# Patient Record
Sex: Male | Born: 1970 | Race: White | Hispanic: No | Marital: Single | Smoking: Current every day smoker
Health system: Southern US, Community
[De-identification: ages and names within clinical notes are randomized; demographics above are authoritative.]

---

## 1998-01-01 ENCOUNTER — Emergency Department (HOSPITAL_COMMUNITY): Admission: EM | Admit: 1998-01-01 | Discharge: 1998-01-01 | Payer: Self-pay | Admitting: Emergency Medicine

## 1999-03-20 ENCOUNTER — Emergency Department (HOSPITAL_COMMUNITY): Admission: EM | Admit: 1999-03-20 | Discharge: 1999-03-20 | Payer: Self-pay | Admitting: Emergency Medicine

## 1999-03-28 ENCOUNTER — Encounter: Admission: RE | Admit: 1999-03-28 | Discharge: 1999-03-28 | Payer: Self-pay | Admitting: Internal Medicine

## 2017-09-12 ENCOUNTER — Inpatient Hospital Stay (HOSPITAL_COMMUNITY)
Admission: EM | Admit: 2017-09-12 | Discharge: 2017-09-14 | DRG: 917 | Disposition: A | Discharge status: Other Acute Inpatient Hospital | Payer: BLUE CROSS/BLUE SHIELD | Attending: Family Medicine | Admitting: Family Medicine

## 2017-09-12 ENCOUNTER — Encounter (HOSPITAL_COMMUNITY): Payer: Self-pay | Admitting: Emergency Medicine

## 2017-09-12 ENCOUNTER — Emergency Department (HOSPITAL_COMMUNITY): Payer: BLUE CROSS/BLUE SHIELD

## 2017-09-12 DIAGNOSIS — F1290 Cannabis use, unspecified, uncomplicated: Secondary | ICD-10-CM | POA: Diagnosis present

## 2017-09-12 DIAGNOSIS — F332 Major depressive disorder, recurrent severe without psychotic features: Secondary | ICD-10-CM | POA: Diagnosis present

## 2017-09-12 DIAGNOSIS — J9601 Acute respiratory failure with hypoxia: Secondary | ICD-10-CM | POA: Diagnosis present

## 2017-09-12 DIAGNOSIS — Z978 Presence of other specified devices: Secondary | ICD-10-CM

## 2017-09-12 DIAGNOSIS — Y907 Blood alcohol level of 200-239 mg/100 ml: Secondary | ICD-10-CM | POA: Diagnosis present

## 2017-09-12 DIAGNOSIS — F1110 Opioid abuse, uncomplicated: Secondary | ICD-10-CM | POA: Diagnosis present

## 2017-09-12 DIAGNOSIS — Z9189 Other specified personal risk factors, not elsewhere classified: Secondary | ICD-10-CM

## 2017-09-12 DIAGNOSIS — F10229 Alcohol dependence with intoxication, unspecified: Secondary | ICD-10-CM | POA: Diagnosis present

## 2017-09-12 DIAGNOSIS — T424X2A Poisoning by benzodiazepines, intentional self-harm, initial encounter: Principal | ICD-10-CM | POA: Diagnosis present

## 2017-09-12 DIAGNOSIS — E872 Acidosis: Secondary | ICD-10-CM | POA: Diagnosis present

## 2017-09-12 DIAGNOSIS — T50902A Poisoning by unspecified drugs, medicaments and biological substances, intentional self-harm, initial encounter: Secondary | ICD-10-CM

## 2017-09-12 DIAGNOSIS — T1491XA Suicide attempt, initial encounter: Secondary | ICD-10-CM | POA: Diagnosis not present

## 2017-09-12 DIAGNOSIS — F1020 Alcohol dependence, uncomplicated: Secondary | ICD-10-CM | POA: Diagnosis not present

## 2017-09-12 DIAGNOSIS — R0689 Other abnormalities of breathing: Secondary | ICD-10-CM

## 2017-09-12 DIAGNOSIS — R402431 Glasgow coma scale score 3-8, in the field [EMT or ambulance]: Secondary | ICD-10-CM

## 2017-09-12 DIAGNOSIS — G92 Toxic encephalopathy: Secondary | ICD-10-CM | POA: Diagnosis present

## 2017-09-12 DIAGNOSIS — R739 Hyperglycemia, unspecified: Secondary | ICD-10-CM | POA: Diagnosis present

## 2017-09-12 DIAGNOSIS — J9811 Atelectasis: Secondary | ICD-10-CM | POA: Diagnosis present

## 2017-09-12 DIAGNOSIS — F1011 Alcohol abuse, in remission: Secondary | ICD-10-CM

## 2017-09-12 DIAGNOSIS — Z8659 Personal history of other mental and behavioral disorders: Secondary | ICD-10-CM

## 2017-09-12 DIAGNOSIS — G9340 Encephalopathy, unspecified: Secondary | ICD-10-CM | POA: Diagnosis present

## 2017-09-12 DIAGNOSIS — F1490 Cocaine use, unspecified, uncomplicated: Secondary | ICD-10-CM | POA: Diagnosis present

## 2017-09-12 DIAGNOSIS — Z781 Physical restraint status: Secondary | ICD-10-CM

## 2017-09-12 DIAGNOSIS — R450 Nervousness: Secondary | ICD-10-CM | POA: Diagnosis not present

## 2017-09-12 DIAGNOSIS — F17210 Nicotine dependence, cigarettes, uncomplicated: Secondary | ICD-10-CM | POA: Diagnosis not present

## 2017-09-12 LAB — CBC WITH DIFFERENTIAL/PLATELET
BASOS ABS: 0 10*3/uL (ref 0.0–0.1)
Basophils Relative: 0 %
EOS PCT: 1 %
Eosinophils Absolute: 0.2 10*3/uL (ref 0.0–0.7)
HCT: 51.7 % (ref 39.0–52.0)
Hemoglobin: 17.4 g/dL — ABNORMAL HIGH (ref 13.0–17.0)
LYMPHS ABS: 3.6 10*3/uL (ref 0.7–4.0)
LYMPHS PCT: 30 %
MCH: 31.6 pg (ref 26.0–34.0)
MCHC: 33.7 g/dL (ref 30.0–36.0)
MCV: 93.8 fL (ref 78.0–100.0)
Monocytes Absolute: 0.6 10*3/uL (ref 0.1–1.0)
Monocytes Relative: 5 %
NEUTROS ABS: 7.9 10*3/uL — AB (ref 1.7–7.7)
Neutrophils Relative %: 64 %
PLATELETS: 233 10*3/uL (ref 150–400)
RBC: 5.51 MIL/uL (ref 4.22–5.81)
RDW: 14.8 % (ref 11.5–15.5)
WBC: 12.3 10*3/uL — AB (ref 4.0–10.5)

## 2017-09-12 LAB — COMPREHENSIVE METABOLIC PANEL
ALK PHOS: 60 U/L (ref 38–126)
ALT: 26 U/L (ref 17–63)
ANION GAP: 14 (ref 5–15)
AST: 33 U/L (ref 15–41)
Albumin: 4 g/dL (ref 3.5–5.0)
BILIRUBIN TOTAL: 0.7 mg/dL (ref 0.3–1.2)
BUN: 8 mg/dL (ref 6–20)
CALCIUM: 8.7 mg/dL — AB (ref 8.9–10.3)
CO2: 21 mmol/L — ABNORMAL LOW (ref 22–32)
Chloride: 105 mmol/L (ref 101–111)
Creatinine, Ser: 1.15 mg/dL (ref 0.61–1.24)
GFR calc Af Amer: >60 mL/min (ref >60)
Glucose, Bld: 124 mg/dL — ABNORMAL HIGH (ref 65–99)
POTASSIUM: 3.5 mmol/L (ref 3.5–5.1)
Sodium: 140 mmol/L (ref 135–145)
TOTAL PROTEIN: 6.5 g/dL (ref 6.5–8.1)

## 2017-09-12 LAB — URINALYSIS, ROUTINE W REFLEX MICROSCOPIC
BACTERIA UA: NONE SEEN
BILIRUBIN URINE: NEGATIVE
Glucose, UA: NEGATIVE mg/dL
KETONES UR: NEGATIVE mg/dL
Leukocytes, UA: NEGATIVE
NITRITE: NEGATIVE
PH: 5 (ref 5.0–8.0)
Protein, ur: NEGATIVE mg/dL
Specific Gravity, Urine: 1.005 (ref 1.005–1.030)

## 2017-09-12 LAB — GLUCOSE, CAPILLARY: Glucose-Capillary: 100 mg/dL — ABNORMAL HIGH (ref 65–99)

## 2017-09-12 LAB — RAPID URINE DRUG SCREEN, HOSP PERFORMED
Amphetamines: NOT DETECTED
BENZODIAZEPINES: POSITIVE — AB
Barbiturates: NOT DETECTED
COCAINE: NOT DETECTED
Opiates: NOT DETECTED
Tetrahydrocannabinol: NOT DETECTED

## 2017-09-12 LAB — SALICYLATE LEVEL: Salicylate Lvl: <7 mg/dL (ref 2.8–30.0)

## 2017-09-12 LAB — I-STAT CG4 LACTIC ACID, ED
LACTIC ACID, VENOUS: 2.85 mmol/L — AB (ref 0.5–1.9)
Lactic Acid, Venous: 3.08 mmol/L (ref 0.5–1.9)

## 2017-09-12 LAB — TRIGLYCERIDES: TRIGLYCERIDES: 178 mg/dL — AB (ref <150)

## 2017-09-12 LAB — I-STAT CHEM 8, ED
BUN: 8 mg/dL (ref 6–20)
CALCIUM ION: 0.99 mmol/L — AB (ref 1.15–1.40)
CHLORIDE: 106 mmol/L (ref 101–111)
Creatinine, Ser: 1.3 mg/dL — ABNORMAL HIGH (ref 0.61–1.24)
Glucose, Bld: 122 mg/dL — ABNORMAL HIGH (ref 65–99)
HCT: 52 % (ref 39.0–52.0)
HEMOGLOBIN: 17.7 g/dL — AB (ref 13.0–17.0)
Potassium: 3.2 mmol/L — ABNORMAL LOW (ref 3.5–5.1)
SODIUM: 142 mmol/L (ref 135–145)
TCO2: 23 mmol/L (ref 22–32)

## 2017-09-12 LAB — I-STAT TROPONIN, ED: Troponin i, poc: 0 ng/mL (ref 0.00–0.08)

## 2017-09-12 LAB — ACETAMINOPHEN LEVEL: Acetaminophen (Tylenol), Serum: <10 ug/mL — ABNORMAL LOW (ref 10–30)

## 2017-09-12 LAB — ETHANOL: ALCOHOL ETHYL (B): 208 mg/dL — AB (ref <10)

## 2017-09-12 MED ORDER — FENTANYL CITRATE (PF) 100 MCG/2ML IJ SOLN
100.0000 ug | INTRAMUSCULAR | Status: DC | PRN
  Filled 2017-09-12: qty 2

## 2017-09-12 MED ORDER — NALOXONE HCL 2 MG/2ML IJ SOSY
PREFILLED_SYRINGE | INTRAMUSCULAR | Status: AC
  Administered 2017-09-12: 2 mg
  Filled 2017-09-12: qty 2

## 2017-09-12 MED ORDER — ADULT MULTIVITAMIN W/MINERALS CH
1.0000 | ORAL_TABLET | Freq: Every day | ORAL | Status: DC
  Administered 2017-09-13 – 2017-09-14 (×2): 1
  Filled 2017-09-12 (×2): qty 1

## 2017-09-12 MED ORDER — SODIUM CHLORIDE 0.9 % IV SOLN
250.0000 mL | INTRAVENOUS | Status: DC | PRN
  Administered 2017-09-12: 250 mL via INTRAVENOUS

## 2017-09-12 MED ORDER — SODIUM CHLORIDE 0.9 % IV BOLUS
1000.0000 mL | Freq: Once | INTRAVENOUS | Status: AC
  Administered 2017-09-12: 1000 mL via INTRAVENOUS

## 2017-09-12 MED ORDER — FENTANYL CITRATE (PF) 100 MCG/2ML IJ SOLN
100.0000 ug | INTRAMUSCULAR | Status: DC | PRN
  Administered 2017-09-13: 100 ug via INTRAVENOUS

## 2017-09-12 MED ORDER — PROPOFOL 1000 MG/100ML IV EMUL
5.0000 ug/kg/min | INTRAVENOUS | Status: DC
  Administered 2017-09-12: 5 ug/kg/min via INTRAVENOUS
  Filled 2017-09-12: qty 100

## 2017-09-12 MED ORDER — FOLIC ACID 1 MG PO TABS
1.0000 mg | ORAL_TABLET | Freq: Every day | ORAL | Status: DC
  Administered 2017-09-13 – 2017-09-14 (×2): 1 mg
  Filled 2017-09-12 (×2): qty 1

## 2017-09-12 MED ORDER — FENTANYL CITRATE (PF) 100 MCG/2ML IJ SOLN
INTRAMUSCULAR | Status: AC
  Administered 2017-09-12: 100 ug
  Filled 2017-09-12: qty 2

## 2017-09-12 MED ORDER — ETOMIDATE 2 MG/ML IV SOLN
INTRAVENOUS | Status: AC | PRN
  Administered 2017-09-12: 20 mg via INTRAVENOUS

## 2017-09-12 MED ORDER — DOCUSATE SODIUM 50 MG/5ML PO LIQD: 100.0000 mg | Freq: Two times a day (BID) | ORAL | Status: DC | PRN

## 2017-09-12 MED ORDER — INSULIN ASPART 100 UNIT/ML ~~LOC~~ SOLN: 1.0000 [IU] | SUBCUTANEOUS | Status: DC

## 2017-09-12 MED ORDER — ROCURONIUM BROMIDE 50 MG/5ML IV SOLN
INTRAVENOUS | Status: AC | PRN
  Administered 2017-09-12: 90 mg via INTRAVENOUS

## 2017-09-12 MED ORDER — BISACODYL 10 MG RE SUPP: 10.0000 mg | Freq: Every day | RECTAL | Status: DC | PRN

## 2017-09-12 MED ORDER — THIAMINE HCL 100 MG/ML IJ SOLN
100.0000 mg | Freq: Every day | INTRAMUSCULAR | Status: DC
  Administered 2017-09-13 – 2017-09-14 (×3): 100 mg via INTRAVENOUS
  Filled 2017-09-12 (×3): qty 2

## 2017-09-12 MED ORDER — HEPARIN SODIUM (PORCINE) 5000 UNIT/ML IJ SOLN
5000.0000 [IU] | Freq: Three times a day (TID) | INTRAMUSCULAR | Status: DC
  Administered 2017-09-13 – 2017-09-14 (×5): 5000 [IU] via SUBCUTANEOUS
  Filled 2017-09-12 (×5): qty 1

## 2017-09-12 MED ORDER — PROPOFOL 1000 MG/100ML IV EMUL
0.0000 ug/kg/min | INTRAVENOUS | Status: DC
  Administered 2017-09-12: 40 ug/kg/min via INTRAVENOUS
  Administered 2017-09-13: 20 ug/kg/min via INTRAVENOUS
  Filled 2017-09-12: qty 100

## 2017-09-12 MED ORDER — LACTATED RINGERS IV SOLN
INTRAVENOUS | Status: DC
  Administered 2017-09-12: via INTRAVENOUS

## 2017-09-12 MED ORDER — POTASSIUM CHLORIDE 10 MEQ/100ML IV SOLN
10.0000 meq | INTRAVENOUS | Status: AC
  Administered 2017-09-13 (×4): 10 meq via INTRAVENOUS
  Filled 2017-09-12 (×4): qty 100

## 2017-09-12 MED ORDER — FAMOTIDINE 40 MG/5ML PO SUSR
20.0000 mg | Freq: Two times a day (BID) | ORAL | Status: DC
  Administered 2017-09-13: 20 mg
  Filled 2017-09-12: qty 2.5

## 2017-09-12 MED ORDER — PROPOFOL 10 MG/ML IV BOLUS
INTRAVENOUS | Status: AC
  Administered 2017-09-12: 200 mg
  Filled 2017-09-12: qty 20

## 2017-09-12 NOTE — Progress Notes (Signed)
Pt transferred to 3M6 w/o complications. Transported on 100%. Uneventful trip

## 2017-09-12 NOTE — ED Notes (Signed)
Belonging given to wife.

## 2017-09-12 NOTE — ED Triage Notes (Signed)
Brought by ems after being found unresponsive in driveway of residence.  Per family patient was texting he wanted to end it all.  Per family possibly taken xanax.  Empty bottle given to ems.  CBG 60 via ems.  Per family took 28 xanax.  Bottle filled 02/2016.

## 2017-09-12 NOTE — ED Notes (Signed)
Little response after narcan .

## 2017-09-12 NOTE — H&P (Signed)
PULMONARY / CRITICAL CARE MEDICINE   Name: Billy Lawrence MRN: [id number] DOB: January 09, 1971    ADMISSION DATE:  09/12/2017 CONSULTATION DATE:  09/12/2017  REFERRING MD:  Dr. Dalene Seltzer  CHIEF COMPLAINT:  Acute encephalopathy  HISTORY OF PRESENT ILLNESS:   HPI obtained from medical chart review and from family at bedside as patient is intubated and sedated on mechanical ventilation.    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  No other known PMH or daily medications known.    Ex-wife and their two minor kids (14 and 15) at the bedside; they have an older daughter, 45 yo but is estranged from her father.  His 31 year old daughter states she received a concerning text from her father, therefore his ex-wife called him and he said "dont call the police" and that he wanted to "end it all".   Patient's other ex-wife died in Sep 12, 2015 and recently his girlfriend broke up with him.  EMS found the patient with empty bottles of xanax 1 mg prescribed to his deceased's ex-wife in 09/12/15. Suspected up to 20 pills ingested.   Patient arrived in ER minimally responsive with no response to narcan, requiring intubation for airway protection.  No vomiting in ER and OGT with minimal clear output.  Labs noted for Lactate 2.85 increasing to 3.08, WBC 12.3, Hgb 17.4, glucose 124, neg troponin, sCr 1.15, K 3.5, ETOH 208, UDS positive for benzos, negative acetaminophen and salicylate levels.  Concern over head trauma due to forehead abrasion, CT head and cervical negative.  Required propofol in ER for some agitation.  PCCM called for admit.   PAST MEDICAL HISTORY :  He  has no past medical history on file.  PAST SURGICAL HISTORY: He  has no past surgical history on file.  Not on File  No current facility-administered medications on file prior to encounter.    No current outpatient medications on file prior to encounter.    FAMILY HISTORY:  His  has no family status information on file.    SOCIAL HISTORY: He  reports that he has current or past drug history.  REVIEW OF SYSTEMS:   Unable.  SUBJECTIVE:   VITAL SIGNS: BP (!) 120/92   Pulse 93   Temp (!) 96.4 F (35.8 C) (Rectal)   Resp 17   Ht 6' (1.829 m)   Wt 233 lb 11 oz (106 kg)   SpO2 100%   BMI 31.69 kg/m   HEMODYNAMICS:    VENTILATOR SETTINGS: Vent Mode: PRVC FiO2 (%):  [60 %] 60 % Set Rate:  [24 bmp] 24 bmp Vt Set:  [580 mL] 580 mL PEEP:  [5 cmH20] 5 cmH20 Plateau Pressure:  [16 cmH20] 16 cmH20  INTAKE / OUTPUT: No intake/output data recorded.  PHYSICAL EXAMINATION: General:  Critically ill adult male sedated on MV in NAD HEENT: MM pink/moist, ETT/ OGT present, pupils 4/ reactive. Abrasion to right forehead  Neuro: sedated on propofol, does not follow commands CV: rrr, no m/r/g PULM: even/non-labored on MV, lungs bilaterally coarse GI: soft, ND, bs active, foley present Extremities: warm/dry, no BLE edema  Skin: tan, no rashes or lesions  LABS:  BMET Recent Labs  Lab 09/12/17 09-11-45 09/12/17 Sep 11, 2049  NA 142 140  K 3.2* 3.5  CL 106 105  CO2  --  21*  BUN 8 8  CREATININE 1.30* 1.15  GLUCOSE 122* 124*    Electrolytes Recent Labs  Lab  09/12/17 2051  CALCIUM 8.7*    CBC Recent Labs  Lab 09/12/17 2047 09/12/17 2051  WBC  --  12.3*  HGB 17.7* 17.4*  HCT 52.0 51.7  PLT  --  233    Coag's No results for input(s): APTT, INR in the last 168 hours.  Sepsis Markers Recent Labs  Lab 09/12/17 2051  LATICACIDVEN 2.85*    ABG No results for input(s): PHART, PCO2ART, PO2ART in the last 168 hours.  Liver Enzymes Recent Labs  Lab 09/12/17 2051  AST 33  ALT 26  ALKPHOS 60  BILITOT 0.7  ALBUMIN 4.0    Cardiac Enzymes No results for input(s): TROPONINI, PROBNP in the last 168 hours.  Glucose No results for input(s): GLUCAP in the last 168 hours.  Imaging Dg Chest Portable 1 View  Result Date: 09/12/2017 CLINICAL DATA:   Post intubation EXAM: PORTABLE CHEST 1 VIEW COMPARISON:  None. FINDINGS: Endotracheal tube tip is at the carina. Esophageal tube tip is below the diaphragm but non included. Right lung is clear. Patchy atelectasis at the left base. Cardiomediastinal silhouette within normal limits. No pneumothorax. IMPRESSION: 1. Endotracheal tube tip at the carina 2. Esophageal tube tip below the diaphragm but non included 3. Slight elevation of left diaphragm with left basilar atelectasis Electronically Signed   By: Jasmine Pang M.D.   On: 09/12/2017 21:05   STUDIES:  5/4 CT head and cervical >> 1. No acute intracranial pathology. 2. No acute/traumatic cervical spine pathology  CXR 5/4 >> 1. Endotracheal tube tip at the carina 2. Esophageal tube tip below the diaphragm but non included 3. Slight elevation of left diaphragm with left basilar atelectasis  CULTURES: 5/4 MRSA PCR >>  ANTIBIOTICS: none  SIGNIFICANT EVENTS: 5/4 Admit  LINES/TUBES: PIV 20g and 18 g 5/4 ETT >> 5/4 OGT >> 5/4 Foley >>  DISCUSSION: 73 yoM presenting with acute encephalopathy requiring intubation after verbalizing SI to his ex-wife.  Ingested xanax and +ETOH.    ASSESSMENT / PLAN:  PULMONARY A: Acute respiratory insufficiency in the setting of toxic ingestion (ETOH/ xanax) LLL atelectasis vs ? aspiration P:   Full MV support, PRVC 8 cc/kg Wean FiO2 / peep for sats > 92% SBT / extubate after metabolism of meds, SBT in am  VAP protocol  ABG and repeat CXR- may need to retract ETT, unclear if this was done in ER See ID   CARDIOVASCULAR A:  Sinus tachycardia  - neg troponin/ EKG non-acute P:  Tele monitoring Goal MAP > 65 Trend Lactate Repeat EKG and troponin in 3 hours  RENAL A:   Lactate acidosis  P:   Assess Mag 1L bolus, then LR at 50 ml/hr Trend BMP / mag/ phos/ urinary output Replace electrolytes as indicated  GASTROINTESTINAL A:   No acute issues  - normal LFTs P:   NPO for now Pepcid  for SUP  HEMATOLOGIC A:   Hgb 17 P:  Trend CBC SCDs and heparin SQ for VTE ppx   INFECTIOUS A:   Leukocytosis - mild ? LLL  atelectasis vs ? Aspiration - no known vomiting P:   Assess PCT Monitor clinically for now Trend WBC / fever curve  ENDOCRINE A:   Mild hyperglycemia  P:   CBG Start SSI if continued hyperglycemia  NEUROLOGIC A:   Suspected SI attempt with xanax and ETOH Acute encephalopathy related to above At risk for ETOH withdrawals - per family long ETOH abuse - CTH and cervical negative - UDS + for  benzo, tylenol and salicylate neg, ETOH 208 P:   RASS goal: -1 PAD protocol with propofol and prn fentanyl May need to add/ switch to precedex; monitor for withdrawals WAU in am Will need safety sitter at bedside once extubated Psych consult when alert and oriented  Thiamine, MVI, and folate daily    FAMILY  - Updates:  Ex-wife and their two minor children updated at bedside.  She will try and contact the eldest daughter who is 74 but also estranged from her father to determine who will make decisions for patient.  Otherwise it will fall to his parents who have been notified.    - Inter-disciplinary family meet or Palliative Care meeting due by:  5/11  CCT 45 mins  Posey Boyer, AGACNP-BC [facility] Pgr: [phone] or if no answer [phone] 09/12/2017, 11:35 PM

## 2017-09-13 ENCOUNTER — Inpatient Hospital Stay (HOSPITAL_COMMUNITY): Payer: BLUE CROSS/BLUE SHIELD

## 2017-09-13 DIAGNOSIS — F10229 Alcohol dependence with intoxication, unspecified: Secondary | ICD-10-CM

## 2017-09-13 DIAGNOSIS — T510X2A Toxic effect of ethanol, intentional self-harm, initial encounter: Secondary | ICD-10-CM

## 2017-09-13 DIAGNOSIS — Z8659 Personal history of other mental and behavioral disorders: Secondary | ICD-10-CM

## 2017-09-13 DIAGNOSIS — F10239 Alcohol dependence with withdrawal, unspecified: Secondary | ICD-10-CM

## 2017-09-13 DIAGNOSIS — F419 Anxiety disorder, unspecified: Secondary | ICD-10-CM

## 2017-09-13 DIAGNOSIS — J9601 Acute respiratory failure with hypoxia: Secondary | ICD-10-CM

## 2017-09-13 DIAGNOSIS — T424X2A Poisoning by benzodiazepines, intentional self-harm, initial encounter: Principal | ICD-10-CM

## 2017-09-13 DIAGNOSIS — F1011 Alcohol abuse, in remission: Secondary | ICD-10-CM

## 2017-09-13 DIAGNOSIS — Z634 Disappearance and death of family member: Secondary | ICD-10-CM

## 2017-09-13 DIAGNOSIS — R450 Nervousness: Secondary | ICD-10-CM

## 2017-09-13 DIAGNOSIS — T1491XA Suicide attempt, initial encounter: Secondary | ICD-10-CM

## 2017-09-13 DIAGNOSIS — R4587 Impulsiveness: Secondary | ICD-10-CM

## 2017-09-13 DIAGNOSIS — G9340 Encephalopathy, unspecified: Secondary | ICD-10-CM

## 2017-09-13 LAB — POCT I-STAT 3, ART BLOOD GAS (G3+)
ACID-BASE EXCESS: 1 mmol/L (ref 0.0–2.0)
Acid-base deficit: 2 mmol/L (ref 0.0–2.0)
Bicarbonate: 22.9 mmol/L (ref 20.0–28.0)
Bicarbonate: 27.6 mmol/L (ref 20.0–28.0)
O2 Saturation: 99 %
O2 Saturation: 99 %
PCO2 ART: 37 mmHg (ref 32.0–48.0)
PH ART: 7.397 (ref 7.350–7.450)
PO2 ART: 117 mmHg — AB (ref 83.0–108.0)
PO2 ART: 166 mmHg — AB (ref 83.0–108.0)
Patient temperature: 97.8
TCO2: 24 mmol/L (ref 22–32)
TCO2: 29 mmol/L (ref 22–32)
pCO2 arterial: 49 mmHg — ABNORMAL HIGH (ref 32.0–48.0)
pH, Arterial: 7.361 (ref 7.350–7.450)

## 2017-09-13 LAB — BASIC METABOLIC PANEL
Anion gap: 17 — ABNORMAL HIGH (ref 5–15)
BUN: 8 mg/dL (ref 6–20)
CALCIUM: 8.6 mg/dL — AB (ref 8.9–10.3)
CO2: 21 mmol/L — AB (ref 22–32)
CREATININE: 0.94 mg/dL (ref 0.61–1.24)
Chloride: 104 mmol/L (ref 101–111)
GLUCOSE: 62 mg/dL — AB (ref 65–99)
Potassium: 3.8 mmol/L (ref 3.5–5.1)
Sodium: 142 mmol/L (ref 135–145)

## 2017-09-13 LAB — CBC
HEMATOCRIT: 51 % (ref 39.0–52.0)
Hemoglobin: 17.4 g/dL — ABNORMAL HIGH (ref 13.0–17.0)
MCH: 31.8 pg (ref 26.0–34.0)
MCHC: 34.1 g/dL (ref 30.0–36.0)
MCV: 93.2 fL (ref 78.0–100.0)
PLATELETS: 237 10*3/uL (ref 150–400)
RBC: 5.47 MIL/uL (ref 4.22–5.81)
RDW: 14.9 % (ref 11.5–15.5)
WBC: 21.1 10*3/uL — AB (ref 4.0–10.5)

## 2017-09-13 LAB — MAGNESIUM
Magnesium: 1.8 mg/dL (ref 1.7–2.4)
Magnesium: 1.6 mg/dL — ABNORMAL LOW (ref 1.7–2.4)

## 2017-09-13 LAB — GLUCOSE, CAPILLARY
GLUCOSE-CAPILLARY: 99 mg/dL (ref 65–99)
GLUCOSE-CAPILLARY: 109 mg/dL — AB (ref 65–99)
GLUCOSE-CAPILLARY: 90 mg/dL (ref 65–99)
Glucose-Capillary: 70 mg/dL (ref 65–99)
Glucose-Capillary: 122 mg/dL — ABNORMAL HIGH (ref 65–99)

## 2017-09-13 LAB — PHOSPHORUS
PHOSPHORUS: 3.5 mg/dL (ref 2.5–4.6)
Phosphorus: 3.3 mg/dL (ref 2.5–4.6)

## 2017-09-13 LAB — HIV ANTIBODY (ROUTINE TESTING W REFLEX): HIV SCREEN 4TH GENERATION: NONREACTIVE

## 2017-09-13 LAB — PROCALCITONIN: Procalcitonin: <0.1 ng/mL

## 2017-09-13 LAB — TROPONIN I

## 2017-09-13 LAB — MRSA PCR SCREENING: MRSA by PCR: NEGATIVE

## 2017-09-13 LAB — LACTIC ACID, PLASMA: Lactic Acid, Venous: 2.8 mmol/L (ref 0.5–1.9)

## 2017-09-13 MED ORDER — CHLORHEXIDINE GLUCONATE 0.12% ORAL RINSE (MEDLINE KIT)
15.0000 mL | Freq: Two times a day (BID) | OROMUCOSAL | Status: DC
  Administered 2017-09-13 (×2): 15 mL via OROMUCOSAL

## 2017-09-13 MED ORDER — FENTANYL 2500MCG IN NS 250ML (10MCG/ML) PREMIX INFUSION: 25.0000 ug/h | INTRAVENOUS | Status: DC

## 2017-09-13 MED ORDER — FAMOTIDINE 20 MG PO TABS
20.0000 mg | ORAL_TABLET | Freq: Two times a day (BID) | ORAL | Status: DC
  Administered 2017-09-13 – 2017-09-14 (×2): 20 mg via ORAL
  Filled 2017-09-13 (×2): qty 1

## 2017-09-13 MED ORDER — FENTANYL BOLUS VIA INFUSION: 50.0000 ug | INTRAVENOUS | Status: DC | PRN

## 2017-09-13 MED ORDER — PROPOFOL 1000 MG/100ML IV EMUL: 0.0000 ug/kg/min | INTRAVENOUS | Status: DC

## 2017-09-13 MED ORDER — DEXTROSE-NACL 5-0.9 % IV SOLN
INTRAVENOUS | Status: DC
  Administered 2017-09-13 – 2017-09-14 (×4): via INTRAVENOUS

## 2017-09-13 MED ORDER — SODIUM CHLORIDE 0.9 % IV BOLUS
500.0000 mL | Freq: Once | INTRAVENOUS | Status: AC
Start: 2017-09-13 — End: 2017-09-13
  Administered 2017-09-13: 500 mL via INTRAVENOUS

## 2017-09-13 MED ORDER — FENTANYL CITRATE (PF) 100 MCG/2ML IJ SOLN: 50.0000 ug | Freq: Once | INTRAMUSCULAR | Status: DC

## 2017-09-13 MED ORDER — GABAPENTIN 600 MG PO TABS
300.0000 mg | ORAL_TABLET | Freq: Two times a day (BID) | ORAL | Status: DC
  Filled 2017-09-13: qty 0.5

## 2017-09-13 MED ORDER — MAGNESIUM SULFATE 2 GM/50ML IV SOLN
2.0000 g | Freq: Once | INTRAVENOUS | Status: AC
  Administered 2017-09-13: 2 g via INTRAVENOUS
  Filled 2017-09-13: qty 50

## 2017-09-13 MED ORDER — WHITE PETROLATUM EX OINT
TOPICAL_OINTMENT | CUTANEOUS | Status: AC
  Administered 2017-09-13: 1
  Filled 2017-09-13: qty 28.35

## 2017-09-13 MED ORDER — ORAL CARE MOUTH RINSE
15.0000 mL | Freq: Four times a day (QID) | OROMUCOSAL | Status: DC
  Administered 2017-09-13 (×3): 15 mL via OROMUCOSAL

## 2017-09-13 MED ORDER — GABAPENTIN 300 MG PO CAPS
300.0000 mg | ORAL_CAPSULE | Freq: Two times a day (BID) | ORAL | Status: DC
  Administered 2017-09-13: 300 mg via ORAL
  Filled 2017-09-13 (×2): qty 1

## 2017-09-13 NOTE — ED Provider Notes (Signed)
[facility] Provider Note   CSN: [id number] Arrival date & time: 09/12/17  2026     History   Chief Complaint Chief Complaint  Patient presents with  . Drug Overdose    HPI Billy Lawrence is a 47 y.o. male.  HPI   47 year old male with a history of bipolar disorder off of medications, presents with concern for unresponsiveness and suicide attempt.  Family reports that at approximately 5 PM, he had texted them that he was going to kill himself.  He said he was going to take a bunch of Xanax and drink alcohol.  When his ex-wife went to his home to check on him, found him unresponsive.  Ex-wife denies any other known ingestions.  Reports that he has a history of bipolar disorder and does not take medications, and instead self medicates and takes drinks alcohol and has other issues with substance abuse.  Reports that he has never been admitted to a psychiatric facility before, and while he is threatened suicide multiple times in the past, has never made an attempt like today.  EMS gave the patient 4 mg of Narcan in the field without any improvement of his mental status.  History reviewed. No pertinent past medical history.  Patient Active Problem List   Diagnosis Date Noted  . Acute encephalopathy 09/12/2017    History reviewed. No pertinent surgical history.      Home Medications    Prior to Admission medications   Not on File    Family History No family history on file.  Social History Social History   Tobacco Use  . Smoking status: Unknown If Ever Smoked  Substance Use Topics  . Alcohol use: Not on file  . Drug use: Yes     Allergies   Patient has no known allergies.   Review of Systems Review of Systems  Unable to perform ROS: Patient unresponsive     Physical Exam Updated Vital Signs BP 96/67   Pulse 77   Temp 97.8 F (36.6 C) (Oral)   Resp (!) 24   Ht 6' (1.829 m)   Wt 100.6 kg (221 lb 12.5 oz)   SpO2 100%   BMI 30.08  kg/m   Physical Exam  Constitutional: He appears well-developed and well-nourished. He has a sickly appearance. He appears ill. No distress.  HENT:  Head: Normocephalic and atraumatic.  Eyes: Conjunctivae and EOM are normal.  Neck: Normal range of motion.  Cardiovascular: Normal rate, regular rhythm, normal heart sounds and intact distal pulses. Exam reveals no gallop and no friction rub.  No murmur heard. Pulmonary/Chest: Effort normal and breath sounds normal. No respiratory distress. He has no wheezes. He has no rales.  Abdominal: Soft. He exhibits no distension. There is no tenderness. There is no guarding.  Musculoskeletal: He exhibits no edema.  Neurological: He is unresponsive. GCS eye subscore is 1. GCS verbal subscore is 2. GCS motor subscore is 5.  Skin: Skin is warm and dry. He is not diaphoretic.  Nursing note and vitals reviewed.    ED Treatments / Results  Labs (all labs ordered are listed, but only abnormal results are displayed) Labs Reviewed  COMPREHENSIVE METABOLIC PANEL - Abnormal; Notable for the following components:      Result Value   CO2 21 (*)    Glucose, Bld 124 (*)    Calcium 8.7 (*)    All other components within normal limits  ETHANOL - Abnormal; Notable for the following components:  Alcohol, Ethyl (B) 208 (*)    All other components within normal limits  RAPID URINE DRUG SCREEN, HOSP PERFORMED - Abnormal; Notable for the following components:   Benzodiazepines POSITIVE (*)    All other components within normal limits  CBC WITH DIFFERENTIAL/PLATELET - Abnormal; Notable for the following components:   WBC 12.3 (*)    Hemoglobin 17.4 (*)    Neutro Abs 7.9 (*)    All other components within normal limits  ACETAMINOPHEN LEVEL - Abnormal; Notable for the following components:   Acetaminophen (Tylenol), Serum <10 (*)    All other components within normal limits  URINALYSIS, ROUTINE W REFLEX MICROSCOPIC - Abnormal; Notable for the following  components:   Hgb urine dipstick SMALL (*)    All other components within normal limits  TRIGLYCERIDES - Abnormal; Notable for the following components:   Triglycerides 178 (*)    All other components within normal limits  GLUCOSE, CAPILLARY - Abnormal; Notable for the following components:   Glucose-Capillary 100 (*)    All other components within normal limits  I-STAT CHEM 8, ED - Abnormal; Notable for the following components:   Potassium 3.2 (*)    Creatinine, Ser 1.30 (*)    Glucose, Bld 122 (*)    Calcium, Ion 0.99 (*)    Hemoglobin 17.7 (*)    All other components within normal limits  I-STAT CG4 LACTIC ACID, ED - Abnormal; Notable for the following components:   Lactic Acid, Venous 2.85 (*)    All other components within normal limits  I-STAT CG4 LACTIC ACID, ED - Abnormal; Notable for the following components:   Lactic Acid, Venous 3.08 (*)    All other components within normal limits  POCT I-STAT 3, ART BLOOD GAS (G3+) - Abnormal; Notable for the following components:   pO2, Arterial 117.0 (*)    All other components within normal limits  MRSA PCR SCREENING  CULTURE, RESPIRATORY (NON-EXPECTORATED)  SALICYLATE LEVEL  PHOSPHORUS  MAGNESIUM  BLOOD GAS, ARTERIAL  MAGNESIUM  LACTIC ACID, PLASMA  LACTIC ACID, PLASMA  HIV ANTIBODY (ROUTINE TESTING)  CBC  BASIC METABOLIC PANEL  PHOSPHORUS  PROCALCITONIN  TROPONIN I  I-STAT TROPONIN, ED  I-STAT VENOUS BLOOD GAS, ED    EKG EKG Interpretation  Date/Time:  Saturday Sep 12 2017 20:37:17 EDT Ventricular Rate:  115 PR Interval:    QRS Duration: 104 QT Interval:  342 QTC Calculation: 473 R Axis:   39 Text Interpretation:  Sinus tachycardia Borderline T abnormalities, inferior leads No previous ECGs available Confirmed by Gareth Morgan [phone]) on 09/12/2017 9:52:57 PM   Radiology Ct Head Wo Contrast  Result Date: 09/12/2017 CLINICAL DATA:  47 year old male with head trauma. EXAM: CT HEAD WITHOUT CONTRAST CT  CERVICAL SPINE WITHOUT CONTRAST TECHNIQUE: Multidetector CT imaging of the head and cervical spine was performed following the standard protocol without intravenous contrast. Multiplanar CT image reconstructions of the cervical spine were also generated. COMPARISON:  None. FINDINGS: CT HEAD FINDINGS Brain: No evidence of acute infarction, hemorrhage, hydrocephalus, extra-axial collection or mass lesion/mass effect. Vascular: No hyperdense vessel or unexpected calcification. Skull: Normal. Negative for fracture or focal lesion. Sinuses/Orbits: Mild diffuse mucoperiosteal thickening of paranasal sinuses. No air-fluid levels. The mastoid air cells are clear. Cerumen noted in the external auditory canals bilaterally. Other: None CT CERVICAL SPINE FINDINGS Alignment: No acute subluxation. Skull base and vertebrae: No acute fracture. No primary bone lesion or focal pathologic process. Soft tissues and spinal canal: No prevertebral fluid or swelling. No  visible canal hematoma. Disc levels:  Mild degenerative changes primarily at C5-C6. Upper chest: Negative. Other: An endotracheal and an enteric tube are partially visualized. IMPRESSION: 1. No acute intracranial pathology. 2. No acute/traumatic cervical spine pathology. Electronically Signed   By: Anner Crete M.D.   On: 09/12/2017 22:37   Ct Cervical Spine Wo Contrast  Result Date: 09/12/2017 CLINICAL DATA:  47 year old male with head trauma. EXAM: CT HEAD WITHOUT CONTRAST CT CERVICAL SPINE WITHOUT CONTRAST TECHNIQUE: Multidetector CT imaging of the head and cervical spine was performed following the standard protocol without intravenous contrast. Multiplanar CT image reconstructions of the cervical spine were also generated. COMPARISON:  None. FINDINGS: CT HEAD FINDINGS Brain: No evidence of acute infarction, hemorrhage, hydrocephalus, extra-axial collection or mass lesion/mass effect. Vascular: No hyperdense vessel or unexpected calcification. Skull: Normal.  Negative for fracture or focal lesion. Sinuses/Orbits: Mild diffuse mucoperiosteal thickening of paranasal sinuses. No air-fluid levels. The mastoid air cells are clear. Cerumen noted in the external auditory canals bilaterally. Other: None CT CERVICAL SPINE FINDINGS Alignment: No acute subluxation. Skull base and vertebrae: No acute fracture. No primary bone lesion or focal pathologic process. Soft tissues and spinal canal: No prevertebral fluid or swelling. No visible canal hematoma. Disc levels:  Mild degenerative changes primarily at C5-C6. Upper chest: Negative. Other: An endotracheal and an enteric tube are partially visualized. IMPRESSION: 1. No acute intracranial pathology. 2. No acute/traumatic cervical spine pathology. Electronically Signed   By: Anner Crete M.D.   On: 09/12/2017 22:37   Dg Chest Port 1 View  Result Date: 09/13/2017 CLINICAL DATA:  47 year old male post intubation EXAM: PORTABLE CHEST 1 VIEW COMPARISON:  .  Chest radiograph dated 09/12/2017 FINDINGS: There has been interval retraction of the endotracheal tube with tip approximately 2.7 cm above the carina. An enteric tube extends into the left hemiabdomen with tip beyond the inferior margin of the image. There is a small left pleural effusion with left lung base atelectasis. Infiltrate is not excluded. Clinical correlation is recommended. The right lung is clear. Mildly enlarged cardiac silhouette. No acute osseous pathology. IMPRESSION: 1. Interval retraction of the endotracheal tube with tip now approximately 2.7 cm above the carina. 2. Small left pleural effusion and left lung base atelectasis versus infiltrate. 3. Mild cardiomegaly. Electronically Signed   By: Anner Crete M.D.   On: 09/13/2017 00:58   Dg Chest Portable 1 View  Result Date: 09/12/2017 CLINICAL DATA:  Post intubation EXAM: PORTABLE CHEST 1 VIEW COMPARISON:  None. FINDINGS: Endotracheal tube tip is at the carina. Esophageal tube tip is below the diaphragm  but non included. Right lung is clear. Patchy atelectasis at the left base. Cardiomediastinal silhouette within normal limits. No pneumothorax. IMPRESSION: 1. Endotracheal tube tip at the carina 2. Esophageal tube tip below the diaphragm but non included 3. Slight elevation of left diaphragm with left basilar atelectasis Electronically Signed   By: Donavan Foil M.D.   On: 09/12/2017 21:05    Procedures .Critical Care Performed by: Gareth Morgan, MD Authorized by: Gareth Morgan, MD   Critical care provider statement:    Critical care time (minutes):  30   Critical care was time spent personally by me on the following activities:  Examination of patient, obtaining history from patient or surrogate, re-evaluation of patient's condition, ordering and review of radiographic studies, ordering and review of laboratory studies and ordering and performing treatments and interventions Procedure Name: Intubation Date/Time: 09/13/2017 1:57 AM Performed by: Gareth Morgan, MD Pre-anesthesia Checklist: Patient identified,  Patient being monitored, Emergency Drugs available, Timeout performed and Suction available Oxygen Delivery Method: Ambu bag Preoxygenation: Pre-oxygenation with 100% oxygen Induction Type: Rapid sequence Ventilation: Mask ventilation without difficulty Laryngoscope Size: Glidescope Grade View: Grade I Tube size: 7.5 mm Number of attempts: 1 Placement Confirmation: ETT inserted through vocal cords under direct vision,  CO2 detector and Breath sounds checked- equal and bilateral Comments: Pulled back ETT 2cm after intubation XR      (including critical care time)  Medications Ordered in ED Medications  potassium chloride 10 mEq in 100 mL IVPB (10 mEq Intravenous New Bag/Given 09/13/17 0200)  heparin injection 5,000 Units (5,000 Units Subcutaneous Given 09/13/17 0007)  famotidine (PEPCID) 40 MG/5ML suspension 20 mg (20 mg Per Tube Not Given 09/13/17 0001)  thiamine (B-1)  injection 100 mg (100 mg Intravenous Given 09/13/17 0005)  fentaNYL (SUBLIMAZE) injection 100 mcg (has no administration in time range)  fentaNYL (SUBLIMAZE) injection 100 mcg (has no administration in time range)  propofol (DIPRIVAN) 1000 MG/100ML infusion (20 mcg/kg/min  106 kg Intravenous New Bag/Given 09/13/17 0139)  docusate (COLACE) 50 MG/5ML liquid 100 mg (has no administration in time range)  bisacodyl (DULCOLAX) suppository 10 mg (has no administration in time range)  lactated ringers infusion ( Intravenous Rate/Dose Change 09/13/17 0020)  multivitamin with minerals tablet 1 tablet (has no administration in time range)  folic acid (FOLVITE) tablet 1 mg (has no administration in time range)  chlorhexidine gluconate (MEDLINE KIT) (PERIDEX) 0.12 % solution 15 mL (15 mLs Mouth Rinse Given 09/13/17 0018)  MEDLINE mouth rinse (has no administration in time range)  naloxone [facility]) 2 MG/2ML injection (2 mg  Given 09/12/17 2031)  etomidate (AMIDATE) injection (20 mg Intravenous Given 09/12/17 2037)  rocuronium [facility]) injection (90 mg Intravenous Given 09/12/17 2038)  propofol (DIPRIVAN) 10 mg/mL bolus/IV push (200 mg  Given 09/12/17 2152)  fentaNYL (SUBLIMAZE) 100 MCG/2ML injection (100 mcg  Given 09/12/17 2152)  sodium chloride 0.9 % bolus 1,000 mL (0 mLs Intravenous Stopped 09/13/17 0100)     Initial Impression / Assessment and Plan / ED Course  I have reviewed the triage vital signs and the nursing notes.  Pertinent labs & imaging results that were available during my care of the patient were reviewed by me and considered in my medical decision making (see chart for details).      47 year old male with a history of bipolar disorder off of medications, presents with concern for unresponsiveness and suicide attempt.  Patient arrived to the emergency department, unresponsive with a GCS approximately 7-8, tachycardia, and otherwise normal blood pressures.  Gave 2 mg of Narcan without relief.   Point-of-care glucose on multiple checks as EMS was within normal limits.  He he was intubated for airway protection.  Given concern for possible fall, CT head and cervical spine were done which were within normal limits.  Labs show no sign of other coingestions.    Patient was admitted to the ICU for further care.  He will require psychiatric evaluation following stabilization and if he is not voluntary would recommend IVC given life threatening overdose in suicide attempt.   Final Clinical Impressions(s) / ED Diagnoses   Final diagnoses:  Endotracheal tube present  Intentional drug overdose, initial encounter [facility])  Intentional benzodiazepine overdose, initial encounter ([place])  Glasgow coma scale total score 3-8, in the field (EMT or ambulance) [facility])    ED Discharge Orders    None       Gareth Morgan, MD 09/13/17 [phone]

## 2017-09-13 NOTE — Progress Notes (Signed)
ABG with no hypercapnea. Patient waking up a little more, still very sleepy but now able to state his name and date of birth. Still has rhonchi; will ask RT tn NT suction him. Will also order 1:1 sitter for suicide precautions. Continue to monitor airway closely.

## 2017-09-13 NOTE — Progress Notes (Signed)
Patient had 850cc of brown output from OG tube within 3 hours. [facility]Link notified

## 2017-09-13 NOTE — Progress Notes (Signed)
PULMONARY / CRITICAL CARE MEDICINE   Name: Billy Lawrence MRN: [id number] DOB: 1970-10-30    ADMISSION DATE:  09/12/2017 CONSULTATION DATE:  09/12/2017  REFERRING MD:  Dr. 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  No other known PMH or daily medications known.    Ex-wife and their two minor kids (14 and 15) at the bedside; they have an older daughter, 50 yo but is estranged from her father.  His 20 year old daughter states she received a concerning text from her father, therefore his ex-wife called him and he said "dont call the police" and that he wanted to "end it all".   Patient's other ex-wife died in 09-17-15 and recently his girlfriend broke up with him.  EMS found the patient with empty bottles of xanax 1 mg prescribed to his deceased's ex-wife in Sep 17, 2015. Suspected up to 20 pills ingested.   Patient arrived in ER minimally responsive with no response to narcan, requiring intubation for airway protection.  No vomiting in ER and OGT with minimal clear output.  Labs noted for Lactate 2.85 increasing to 3.08, WBC 12.3, Hgb 17.4, glucose 124, neg troponin, sCr 1.15, K 3.5, ETOH 208, UDS positive for benzos, negative acetaminophen and salicylate levels.  Concern over head trauma due to forehead abrasion, CT head and cervical negative.  Required propofol in ER for some agitation.  PCCM called for admit.   SUBJECTIVE:  Pt self extubated overnight.  Tolerated.  States he is embarrassed, "can't believe I did that / I have too much to live for", did not have any intent for self harm, drinking amplified his emotional state.  Wife passed away after being married for 4 months.    VITAL SIGNS: BP 125/84   Pulse (!) 110   Temp 99.6 F (37.6 C) (Oral)   Resp (!) 24   Ht 6' (1.829 m)   Wt 221 lb 12.5 oz (100.6 kg)   SpO2 96%   BMI 30.08 kg/m   HEMODYNAMICS:     VENTILATOR SETTINGS: Vent Mode: PRVC FiO2 (%):  [40 %-60 %] 40 % Set Rate:  [24 bmp] 24 bmp Vt Set:  [580 mL] 580 mL PEEP:  [5 cmH20] 5 cmH20 Plateau Pressure:  [16 cmH20-17 cmH20] 16 cmH20  INTAKE / OUTPUT: I/O last 3 completed shifts: In: 1674.3 [I.V.:774.3; IV Piggyback:900] Out: 2038-09-17 [Urine:840; Emesis/NG output:1200]  PHYSICAL EXAMINATION: General: adult male in NAD, sitting in bed  HEENT: MM pink/dry, no jvd Neuro: AAOx4, speech clear, MAE  CV: s1s2 rrr, no m/r/g PULM: even/non-labored, lungs bilaterally clear  WU:JWJX, non-tender, bsx4 active  Extremities: warm/dry, no edema  Skin: tan, no rashes or lesions  LABS:  BMET Recent Labs  Lab 09/12/17 09/16/45 09/12/17 09-16-2049 09/13/17 0236  NA 142 140 142  K 3.2* 3.5 3.8  CL 106 105 104  CO2  --  21* 21*  BUN CREATININE 1.30* 1.15 0.94  GLUCOSE 122* 124* 62*    Electrolytes Recent Labs  Lab 09/12/17 09-16-49 09/12/17 2229 09/13/17 0236  CALCIUM 8.7*  --  8.6*  MG  --  1.8 1.6*  PHOS  --  3.5 3.3    CBC Recent Labs  Lab 09/12/17 09-16-2045 09/12/17 2049/09/16 09/13/17 0236  WBC  --  12.3* 21.1*  HGB 17.7* 17.4* 17.4*  HCT 52.0 51.7 51.0  PLT  --  233 237    Coag's No results for input(s): APTT, INR in the last 168 hours.  Sepsis Markers Recent Labs  Lab 09/12/17 2051 09/12/17 2245 09/13/17 0236  LATICACIDVEN 2.85* 3.08* 2.8*  PROCALCITON  --   --  <0.10    ABG Recent Labs  Lab 09/13/17 0039 09/13/17 0454  PHART 7.397 7.361  PCO2ART 37.0 49.0*  PO2ART 117.0* 166.0*    Liver Enzymes Recent Labs  Lab 09/12/17 2051  AST 33  ALT 26  ALKPHOS 60  BILITOT 0.7  ALBUMIN 4.0    Cardiac Enzymes Recent Labs  Lab 09/13/17 0236  TROPONINI <0.03    Glucose Recent Labs  Lab 09/12/17 2329 09/13/17 0330  GLUCAP 100* 70    Imaging Ct Head Wo Contrast  Result Date: 09/12/2017 CLINICAL DATA:  47 year old male with head trauma. EXAM: CT HEAD WITHOUT CONTRAST CT CERVICAL SPINE WITHOUT  CONTRAST TECHNIQUE: Multidetector CT imaging of the head and cervical spine was performed following the standard protocol without intravenous contrast. Multiplanar CT image reconstructions of the cervical spine were also generated. COMPARISON:  None. FINDINGS: CT HEAD FINDINGS Brain: No evidence of acute infarction, hemorrhage, hydrocephalus, extra-axial collection or mass lesion/mass effect. Vascular: No hyperdense vessel or unexpected calcification. Skull: Normal. Negative for fracture or focal lesion. Sinuses/Orbits: Mild diffuse mucoperiosteal thickening of paranasal sinuses. No air-fluid levels. The mastoid air cells are clear. Cerumen noted in the external auditory canals bilaterally. Other: None CT CERVICAL SPINE FINDINGS Alignment: No acute subluxation. Skull base and vertebrae: No acute fracture. No primary bone lesion or focal pathologic process. Soft tissues and spinal canal: No prevertebral fluid or swelling. No visible canal hematoma. Disc levels:  Mild degenerative changes primarily at C5-C6. Upper chest: Negative. Other: An endotracheal and an enteric tube are partially visualized. IMPRESSION: 1. No acute intracranial pathology. 2. No acute/traumatic cervical spine pathology. Electronically Signed   By: Elgie Collard M.D.   On: 09/12/2017 22:37   Ct Cervical Spine Wo Contrast  Result Date: 09/12/2017 CLINICAL DATA:  47 year old male with head trauma. EXAM: CT HEAD WITHOUT CONTRAST CT CERVICAL SPINE WITHOUT CONTRAST TECHNIQUE: Multidetector CT imaging of the head and cervical spine was performed following the standard protocol without intravenous contrast. Multiplanar CT image reconstructions of the cervical spine were also generated. COMPARISON:  None. FINDINGS: CT HEAD FINDINGS Brain: No evidence of acute infarction, hemorrhage, hydrocephalus, extra-axial collection or mass lesion/mass effect. Vascular: No hyperdense vessel or unexpected calcification. Skull: Normal. Negative for fracture or  focal lesion. Sinuses/Orbits: Mild diffuse mucoperiosteal thickening of paranasal sinuses. No air-fluid levels. The mastoid air cells are clear. Cerumen noted in the external auditory canals bilaterally. Other: None CT CERVICAL SPINE FINDINGS Alignment: No acute subluxation. Skull base and vertebrae: No acute fracture. No primary bone lesion or focal pathologic process. Soft tissues and spinal canal: No prevertebral fluid or swelling. No visible canal hematoma. Disc levels:  Mild degenerative changes primarily at C5-C6. Upper chest: Negative. Other: An endotracheal and an enteric tube are partially visualized. IMPRESSION: 1. No acute intracranial pathology. 2. No acute/traumatic cervical spine pathology. Electronically Signed   By: Elgie Collard M.D.   On: 09/12/2017 22:37   Dg Chest Port 1 View  Result Date: 09/13/2017 CLINICAL DATA:  47 year old male post intubation EXAM: PORTABLE CHEST 1 VIEW COMPARISON:  .  Chest radiograph dated 09/12/2017 FINDINGS: There has been interval retraction of the endotracheal tube with tip approximately 2.7 cm above the carina. An enteric tube extends into the left hemiabdomen with tip  beyond the inferior margin of the image. There is a small left pleural effusion with left lung base atelectasis. Infiltrate is not excluded. Clinical correlation is recommended. The right lung is clear. Mildly enlarged cardiac silhouette. No acute osseous pathology. IMPRESSION: 1. Interval retraction of the endotracheal tube with tip now approximately 2.7 cm above the carina. 2. Small left pleural effusion and left lung base atelectasis versus infiltrate. 3. Mild cardiomegaly. Electronically Signed   By: Elgie Collard M.D.   On: 09/13/2017 00:58   Dg Chest Portable 1 View  Result Date: 09/12/2017 CLINICAL DATA:  Post intubation EXAM: PORTABLE CHEST 1 VIEW COMPARISON:  None. FINDINGS: Endotracheal tube tip is at the carina. Esophageal tube tip is below the diaphragm but non included. Right  lung is clear. Patchy atelectasis at the left base. Cardiomediastinal silhouette within normal limits. No pneumothorax. IMPRESSION: 1. Endotracheal tube tip at the carina 2. Esophageal tube tip below the diaphragm but non included 3. Slight elevation of left diaphragm with left basilar atelectasis Electronically Signed   By: Jasmine Pang M.D.   On: 09/12/2017 21:05   STUDIES:  5/4 CT head and cervical >> No acute intracranial pathology.  No acute/traumatic cervical spine pathology  CULTURES: 5/4 MRSA PCR >> negative 5/4 Sputum >>   ANTIBIOTICS:   SIGNIFICANT EVENTS: 5/4 Admit after OD  LINES/TUBES: 5/4 ETT >> 5/5 5/4 OGT >> 5/5 5/4 Foley >> 5/5  DISCUSSION: 38 yoM presenting with acute encephalopathy requiring intubation after verbalizing SI to his ex-wife.  Ingested xanax and +ETOH.    ASSESSMENT / PLAN:  PULMONARY A: Acute respiratory insufficiency in the setting of toxic ingestion (ETOH/ xanax) LLL atelectasis vs ? aspiration P:   Pulmonary hygiene - IS, mobilize  Follow intermittent CXR  CARDIOVASCULAR A:  Sinus tachycardia - neg troponin/ EKG non-acute P:  ICU monitoring  MAP goal > 65  RENAL A:   Lactate acidosis - resolving  P:   Trend BMP / urinary output Replace electrolytes as indicated Avoid nephrotoxic agents, ensure adequate renal perfusion Trend lactate Discontinue foley  D5NS at 125 ml/hr until reliably taking PO's   GASTROINTESTINAL A:   No acute issues - normal LFTs P:   Begin ice chips, clears and advance as tolerated  Pepcid for SUP   HEMATOLOGIC A:   No acute issues P:  Trend CBC Heparin + SCD's for DVT prophylaxis   INFECTIOUS A:   Leukocytosis - mild ? LLL  atelectasis vs ? Aspiration - no known vomiting P:   Trend PCT  Follow WBC / fever curve   ENDOCRINE A:   At Risk Hypo/hyperglycemia  P:   CBG  Monitor glucose on BMP  NEUROLOGIC A:   Suspected SI attempt with xanax and ETOH Acute encephalopathy related to  above At risk for ETOH withdrawals - per family long ETOH abuse - CTH and cervical negative - UDS + for benzo, tylenol and salicylate neg, ETOH 208 P:   RASS goal: n/a Discontinue all sedation  Safety sitter  PSY consult for depression, suicide attempt  Continue thiamine, folate, MVI    FAMILY  - Updates:  No family at bedside am 5/5.  Patient updated on plan of care   - Inter-disciplinary family meet or Palliative Care meeting due by:  5/11   Canary Brim, NP-C [facility] Pgr: [phone] or if no answer [phone] 09/13/2017, 7:43 AM

## 2017-09-13 NOTE — Progress Notes (Signed)
Patient self-extubated despite being in restraints and having propofol infusion. On exam he is somnolent, opens eyes briefly to sternal rub but not obeying commands. Rhonchi on exam. Sinus tach @ 120, Pox 100% on NRB, Normotensive. Concern that patient is not protecting his airway, given his somnolence and rhonchi. Obtain ABG now. Low threshold to reintubate.

## 2017-09-13 NOTE — Consult Note (Signed)
[facility] Face-to-Face Psychiatry Consult   Reason for Consult:  Suicide attempt by overdose Referring Physician:  Dr. Jimmey Ralph Patient Identification: Billy Lawrence MRN:  [id number] Principal Diagnosis: Major depressive disorder, recurrent episode, severe ([place]) Diagnosis:   Patient Active Problem List   Diagnosis Date Noted  . Major depressive disorder, recurrent episode, severe ([place]) [F33.2] 09/13/2017  . Alcohol use disorder, severe, dependence ([place]) [F10.20] 09/13/2017  . Acute encephalopathy [G93.40] 09/12/2017    Total Time spent with patient: 1 hour  Subjective:   Billy Lawrence is a 47 y.o. male patient admitted with drug overdose   HPI:  Patient who reports history of Major depression and Alcohol use disorder -severe. He was brought to the hospital unresponsiveness after he attempted suicide by overdosing on a bunch of Xanax and a fifth of Vodka. Prior to attempting suicide, he sent a text message to his ex-girlfriend stating that he  was going to kill himself. Ex-girlfriend who is at bedside reports that she went to check him at home and found him unresponsive. Patient is very evasive but collateral information gathered from family revealed that he has been dealing with alcoholism and mental illness but he is no-compliant with treatment. Patient also report being traumatized after his first wife died suddenly of massive heart attack in his presence in 2017. He denies psychosis, delusions but says he is extremely depressed.  Past Psychiatric History: as above  Risk to Self: Is patient at risk for suicide?: Yes Risk to Others:   Prior Inpatient Therapy:   Prior Outpatient Therapy:    Past Medical History: History reviewed. No pertinent past medical history. History reviewed. No pertinent surgical history. Family History: No family history on file. Family Psychiatric  History:  Social History:  Social History   Substance and Sexual Activity  Alcohol Use Not on file     Social  History   Substance and Sexual Activity  Drug Use Yes    Social History   Socioeconomic History  . Marital status: Single    Spouse name: Not on file  . Number of children: Not on file  . Years of education: Not on file  . Highest education level: Not on file  Occupational History  . Not on file  Social Needs  . Financial resource strain: Not on file  . Food insecurity:    Worry: Not on file    Inability: Not on file  . Transportation needs:    Medical: Not on file    Non-medical: Not on file  Tobacco Use  . Smoking status: Unknown If Ever Smoked  Substance and Sexual Activity  . Alcohol use: Not on file  . Drug use: Yes  . Sexual activity: Not on file  Lifestyle  . Physical activity:    Days per week: Not on file    Minutes per session: Not on file  . Stress: Not on file  Relationships  . Social connections:    Talks on phone: Not on file    Gets together: Not on file    Attends religious service: Not on file    Active member of club or organization: Not on file    Attends meetings of clubs or organizations: Not on file    Relationship status: Not on file  Other Topics Concern  . Not on file  Social History Narrative  . Not on file   Additional Social History:    Allergies:  No Known Allergies  Labs:  Results for orders placed or performed  during the hospital encounter of 09/12/17 (from the past 48 hour(s))  I-Stat Chem 8, ED     Status: Abnormal   Collection Time: 09/12/17  8:47 PM  Result Value Ref Range   Sodium 142 135 - 145 mmol/L   Potassium 3.2 (L) 3.5 - 5.1 mmol/L   Chloride 106 101 - 111 mmol/L   BUN 8 6 - 20 mg/dL   Creatinine, Ser 1.30 (H) 0.61 - 1.24 mg/dL   Glucose, Bld 122 (H) 65 - 99 mg/dL   Calcium, Ion 0.99 (L) 1.15 - 1.40 mmol/L   TCO2 23 22 - 32 mmol/L   Hemoglobin 17.7 (H) 13.0 - 17.0 g/dL   HCT 52.0 39.0 - 52.0 %  Comprehensive metabolic panel     Status: Abnormal   Collection Time: 09/12/17  8:51 PM  Result Value Ref Range    Sodium 140 135 - 145 mmol/L   Potassium 3.5 3.5 - 5.1 mmol/L   Chloride 105 101 - 111 mmol/L   CO2 21 (L) 22 - 32 mmol/L   Glucose, Bld 124 (H) 65 - 99 mg/dL   BUN 8 6 - 20 mg/dL   Creatinine, Ser 1.15 0.61 - 1.24 mg/dL   Calcium 8.7 (L) 8.9 - 10.3 mg/dL   Total Protein 6.5 6.5 - 8.1 g/dL   Albumin 4.0 3.5 - 5.0 g/dL   AST 33 15 - 41 U/L   ALT 26 17 - 63 U/L   Alkaline Phosphatase 60 38 - 126 U/L   Total Bilirubin 0.7 0.3 - 1.2 mg/dL   GFR calc non Af Amer >60 >60 mL/min   GFR calc Af Amer >60 >60 mL/min    Comment: (NOTE) The eGFR has been calculated using the CKD EPI equation. This calculation has not been validated in all clinical situations. eGFR's persistently <60 mL/min signify possible Chronic Kidney Disease.    Anion gap 14 5 - 15    Comment: Performed at [facility] [address], [place], [place] [address]  Ethanol     Status: Abnormal   Collection Time: 09/12/17  8:51 PM  Result Value Ref Range   Alcohol, Ethyl (B) 208 (H) <10 mg/dL    Comment:        LOWEST DETECTABLE LIMIT FOR SERUM ALCOHOL IS 10 mg/dL FOR MEDICAL PURPOSES ONLY Performed at [facility], [place] [address], [place], [place] [address]   CBC with Diff     Status: Abnormal   Collection Time: 09/12/17  8:51 PM  Result Value Ref Range   WBC 12.3 (H) 4.0 - 10.5 K/uL   RBC 5.51 4.22 - 5.81 MIL/uL   Hemoglobin 17.4 (H) 13.0 - 17.0 g/dL   HCT 51.7 39.0 - 52.0 %   MCV 93.8 78.0 - 100.0 fL   MCH 31.6 26.0 - 34.0 pg   MCHC 33.7 30.0 - 36.0 g/dL   RDW 14.8 11.5 - 15.5 %   Platelets 233 150 - 400 K/uL   Neutrophils Relative % 64 %   Neutro Abs 7.9 (H) 1.7 - 7.7 K/uL   Lymphocytes Relative 30 %   Lymphs Abs 3.6 0.7 - 4.0 K/uL   Monocytes Relative 5 %   Monocytes Absolute 0.6 0.1 - 1.0 K/uL   Eosinophils Relative 1 %   Eosinophils Absolute 0.2 0.0 - 0.7 K/uL   Basophils Relative 0 %   Basophils Absolute 0.0 0.0 - 0.1 K/uL    Comment: Performed at [facility]  [address], [place], [place] [address]  Acetaminophen level     Status: Abnormal   Collection Time: 09/12/17  8:51 PM  Result Value Ref Range   Acetaminophen (Tylenol), Serum <10 (L) 10 - 30 ug/mL    Comment:        THERAPEUTIC CONCENTRATIONS VARY SIGNIFICANTLY. A RANGE OF 10-30 ug/mL MAY BE AN EFFECTIVE CONCENTRATION FOR MANY PATIENTS. HOWEVER, SOME ARE BEST TREATED AT CONCENTRATIONS OUTSIDE THIS RANGE. ACETAMINOPHEN CONCENTRATIONS >150 ug/mL AT 4 HOURS AFTER INGESTION AND >50 ug/mL AT 12 HOURS AFTER INGESTION ARE OFTEN ASSOCIATED WITH TOXIC REACTIONS. Performed at [facility], [place] [address], [place], [place] [address]   Salicylate level     Status: None   Collection Time: 09/12/17  8:51 PM  Result Value Ref Range   Salicylate Lvl <5.1 2.8 - 30.0 mg/dL    Comment: Performed at [facility] [address], [place], [place] [address]  I-Stat CG4 Lactic Acid, ED     Status: Abnormal   Collection Time: 09/12/17  8:51 PM  Result Value Ref Range   Lactic Acid, Venous 2.85 (HH) 0.5 - 1.9 mmol/L   Comment NOTIFIED PHYSICIAN   I-Stat Troponin, ED (not at [facility])     Status: None   Collection Time: 09/12/17  8:51 PM  Result Value Ref Range   Troponin i, poc 0.00 0.00 - 0.08 ng/mL   Comment 3            Comment: Due to the release kinetics of cTnI, a negative result within the first hours of the onset of symptoms does not rule out myocardial infarction with certainty. If myocardial infarction is still suspected, repeat the test at appropriate intervals.   Urine rapid drug screen (hosp performed)     Status: Abnormal   Collection Time: 09/12/17  9:18 PM  Result Value Ref Range   Opiates NONE DETECTED NONE DETECTED   Cocaine NONE DETECTED NONE DETECTED   Benzodiazepines POSITIVE (A) NONE DETECTED   Amphetamines NONE DETECTED NONE DETECTED   Tetrahydrocannabinol NONE DETECTED NONE DETECTED   Barbiturates NONE DETECTED NONE DETECTED    Comment: (NOTE) DRUG SCREEN FOR MEDICAL  PURPOSES ONLY.  IF CONFIRMATION IS NEEDED FOR ANY PURPOSE, NOTIFY LAB WITHIN 5 DAYS. LOWEST DETECTABLE LIMITS FOR URINE DRUG SCREEN Drug Class                     Cutoff (ng/mL) Amphetamine and metabolites    1000 Barbiturate and metabolites    200 Benzodiazepine                 737 Tricyclics and metabolites     300 Opiates and metabolites        300 Cocaine and metabolites        300 THC                            50 Performed at [facility], [place] [address] [place], [place] [address]   Urinalysis, Routine w reflex microscopic     Status: Abnormal   Collection Time: 09/12/17  9:18 PM  Result Value Ref Range   Color, Urine YELLOW YELLOW   APPearance CLEAR CLEAR   Specific Gravity, Urine 1.005 1.005 - 1.030   pH 5.0 5.0 - 8.0   Glucose, UA NEGATIVE NEGATIVE mg/dL   Hgb urine dipstick SMALL (A) NEGATIVE   Bilirubin Urine NEGATIVE NEGATIVE  Ketones, ur NEGATIVE NEGATIVE mg/dL   Protein, ur NEGATIVE NEGATIVE mg/dL   Nitrite NEGATIVE NEGATIVE   Leukocytes, UA NEGATIVE NEGATIVE   RBC / HPF 0-5 0 - 5 RBC/hpf   Bacteria, UA NONE SEEN NONE SEEN   Mucus PRESENT     Comment: Performed at [facility] [address] [place], [place] [address]  Triglycerides     Status: Abnormal   Collection Time: 09/12/17 10:29 PM  Result Value Ref Range   Triglycerides 178 (H) <150 mg/dL    Comment: Performed at [facility] [address], [place], [place] [address]  Phosphorus     Status: None   Collection Time: 09/12/17 10:29 PM  Result Value Ref Range   Phosphorus 3.5 2.5 - 4.6 mg/dL    Comment: Performed at [facility], [place] [address] [place], [place] [address]  Magnesium     Status: None   Collection Time: 09/12/17 10:29 PM  Result Value Ref Range   Magnesium 1.8 1.7 - 2.4 mg/dL    Comment: Performed at [facility], [place] [address] [place], [place] [address]  I-Stat CG4 Lactic Acid, ED     Status: Abnormal   Collection Time: 09/12/17 10:45 PM   Result Value Ref Range   Lactic Acid, Venous 3.08 (HH) 0.5 - 1.9 mmol/L   Comment NOTIFIED PHYSICIAN   Glucose, capillary     Status: Abnormal   Collection Time: 09/12/17 11:29 PM  Result Value Ref Range   Glucose-Capillary 100 (H) 65 - 99 mg/dL  MRSA PCR Screening     Status: None   Collection Time: 09/12/17 11:35 PM  Result Value Ref Range   MRSA by PCR NEGATIVE NEGATIVE    Comment:        The GeneXpert MRSA Assay (FDA approved for NASAL specimens only), is one component of a comprehensive MRSA colonization surveillance program. It is not intended to diagnose MRSA infection nor to guide or monitor treatment for MRSA infections. Performed at [facility], [place] [address] [place], [place] [address]   I-STAT 3, arterial blood gas (G3+)     Status: Abnormal   Collection Time: 09/13/17 12:39 AM  Result Value Ref Range   pH, Arterial 7.397 7.350 - 7.450   pCO2 arterial 37.0 32.0 - 48.0 mmHg   pO2, Arterial 117.0 (H) 83.0 - 108.0 mmHg   Bicarbonate 22.9 20.0 - 28.0 mmol/L   TCO2 24 22 - 32 mmol/L   O2 Saturation 99.0 %   Acid-base deficit 2.0 0.0 - 2.0 mmol/L   Patient temperature 97.8 F    Collection site RADIAL, ALLEN'S TEST ACCEPTABLE    Drawn by RT    Sample type ARTERIAL   Magnesium     Status: Abnormal   Collection Time: 09/13/17  2:36 AM  Result Value Ref Range   Magnesium 1.6 (L) 1.7 - 2.4 mg/dL    Comment: Performed at [facility], [place] [address], [place], [place] [address]  Lactic acid, plasma     Status: Abnormal   Collection Time: 09/13/17  2:36 AM  Result Value Ref Range   Lactic Acid, Venous 2.8 (HH) 0.5 - 1.9 mmol/L    Comment: CRITICAL RESULT CALLED TO, READ BACK BY AND VERIFIED WITH: DONAHUE,R RN 09/13/2017 0327 JORDANS Performed at [facility], [place] [address], [place], [place] [address]   CBC     Status: Abnormal   Collection Time: 09/13/17  2:36  AM  Result Value Ref Range   WBC 21.1 (H) 4.0 - 10.5 K/uL   RBC 5.47 4.22 - 5.81  MIL/uL   Hemoglobin 17.4 (H) 13.0 - 17.0 g/dL   HCT 51.0 39.0 - 52.0 %   MCV 93.2 78.0 - 100.0 fL   MCH 31.8 26.0 - 34.0 pg   MCHC 34.1 30.0 - 36.0 g/dL   RDW 14.9 11.5 - 15.5 %   Platelets 237 150 - 400 K/uL    Comment: Performed at [facility] [address], [place], [place] [address]  Basic metabolic panel     Status: Abnormal   Collection Time: 09/13/17  2:36 AM  Result Value Ref Range   Sodium 142 135 - 145 mmol/L   Potassium 3.8 3.5 - 5.1 mmol/L   Chloride 104 101 - 111 mmol/L   CO2 21 (L) 22 - 32 mmol/L   Glucose, Bld 62 (L) 65 - 99 mg/dL   BUN 8 6 - 20 mg/dL   Creatinine, Ser 0.94 0.61 - 1.24 mg/dL   Calcium 8.6 (L) 8.9 - 10.3 mg/dL   GFR calc non Af Amer >60 >60 mL/min   GFR calc Af Amer >60 >60 mL/min    Comment: (NOTE) The eGFR has been calculated using the CKD EPI equation. This calculation has not been validated in all clinical situations. eGFR's persistently <60 mL/min signify possible Chronic Kidney Disease.    Anion gap 17 (H) 5 - 15    Comment: Performed at [facility], [place] [address], [place], [place] [address]  Phosphorus     Status: None   Collection Time: 09/13/17  2:36 AM  Result Value Ref Range   Phosphorus 3.3 2.5 - 4.6 mg/dL    Comment: Performed at [facility] [address], [place], [place] [address]  Procalcitonin - Baseline     Status: None   Collection Time: 09/13/17  2:36 AM  Result Value Ref Range   Procalcitonin <0.10 ng/mL    Comment:        Interpretation: PCT (Procalcitonin) <= 0.5 ng/mL: Systemic infection (sepsis) is not likely. Local bacterial infection is possible. (NOTE)       Sepsis PCT Algorithm           Lower Respiratory Tract                                      Infection PCT Algorithm    ----------------------------     ----------------------------         PCT < 0.25 ng/mL                PCT < 0.10 ng/mL         Strongly encourage             Strongly discourage   discontinuation of antibiotics     initiation of antibiotics    ----------------------------     -----------------------------       PCT 0.25 - 0.50 ng/mL            PCT 0.10 - 0.25 ng/mL               OR       >80% decrease in PCT            Discourage initiation of  antibiotics      Encourage discontinuation           of antibiotics    ----------------------------     -----------------------------         PCT >= 0.50 ng/mL              PCT 0.26 - 0.50 ng/mL               AND        <80% decrease in PCT             Encourage initiation of                                             antibiotics       Encourage continuation           of antibiotics    ----------------------------     -----------------------------        PCT >= 0.50 ng/mL                  PCT > 0.50 ng/mL               AND         increase in PCT                  Strongly encourage                                      initiation of antibiotics    Strongly encourage escalation           of antibiotics                                     -----------------------------                                           PCT <= 0.25 ng/mL                                                 OR                                        > 80% decrease in PCT                                     Discontinue / Do not initiate                                             antibiotics Performed at [facility], [place] [address], [place], [place] [address]   Troponin I     Status: None   Collection Time: 09/13/17  2:36 AM  Result Value Ref Range   Troponin I <0.03 <0.03 ng/mL    Comment: Performed at [facility] [address] [place], [place] [address]  Glucose, capillary     Status: None   Collection Time: 09/13/17  3:30 AM  Result Value Ref Range   Glucose-Capillary 70 65 - 99 mg/dL  Culture, respiratory (NON-Expectorated)     Status: None (Preliminary result)   Collection Time: 09/13/17  4:36 AM  Result Value Ref  Range   Specimen Description TRACHEAL ASPIRATE    Special Requests NONE    Gram Stain      ABUNDANT WBC PRESENT, PREDOMINANTLY PMN RARE SQUAMOUS EPITHELIAL CELLS PRESENT ABUNDANT GRAM POSITIVE COCCI IN CHAINS IN CLUSTERS MODERATE GRAM NEGATIVE RODS FEW GRAM POSITIVE RODS Performed at [facility], [place] [address] [place], [place] [address]    Culture PENDING    Report Status PENDING   I-STAT 3, arterial blood gas (G3+)     Status: Abnormal   Collection Time: 09/13/17  4:54 AM  Result Value Ref Range   pH, Arterial 7.361 7.350 - 7.450   pCO2 arterial 49.0 (H) 32.0 - 48.0 mmHg   pO2, Arterial 166.0 (H) 83.0 - 108.0 mmHg   Bicarbonate 27.6 20.0 - 28.0 mmol/L   TCO2 29 22 - 32 mmol/L   O2 Saturation 99.0 %   Acid-Base Excess 1.0 0.0 - 2.0 mmol/L   Patient temperature 99.6 F    Collection site RADIAL, ALLEN'S TEST ACCEPTABLE    Drawn by RT    Sample type ARTERIAL   Glucose, capillary     Status: None   Collection Time: 09/13/17  8:14 AM  Result Value Ref Range   Glucose-Capillary 99 65 - 99 mg/dL  Glucose, capillary     Status: Abnormal   Collection Time: 09/13/17 11:50 AM  Result Value Ref Range   Glucose-Capillary 109 (H) 65 - 99 mg/dL    Current Facility-Administered Medications  Medication Dose Route Frequency Provider Last Rate Last Dose  . chlorhexidine gluconate (MEDLINE KIT) (PERIDEX) 0.12 % solution 15 mL  15 mL Mouth Rinse BID Hammonds, Sharyn Blitz, MD   15 mL at 09/13/17 0854  . dextrose 5 %-0.9 % sodium chloride infusion   Intravenous Continuous Deterding, Guadelupe Sabin, MD 125 mL/hr at 09/13/17 1400    . famotidine (PEPCID) 40 MG/5ML suspension 20 mg  20 mg Per Tube BID Jennelle Human B, NP   20 mg at 09/13/17 1022  . folic acid (FOLVITE) tablet 1 mg  1 mg Per Tube Daily Jennelle Human B, NP   1 mg at 09/13/17 1022  . gabapentin (NEURONTIN) tablet 300 mg  300 mg Oral BID Toshi Ishii, MD      . heparin injection 5,000 Units  5,000 Units Subcutaneous Q8H  Jennelle Human B, NP   5,000 Units at 09/13/17 1453  . MEDLINE mouth rinse  15 mL Mouth Rinse QID Hammonds, Sharyn Blitz, MD   15 mL at 09/13/17 1257  . multivitamin with minerals tablet 1 tablet  1 tablet Per Tube Daily Jennelle Human B, NP   1 tablet at 09/13/17 1020  . thiamine (B-1) injection 100 mg  100 mg Intravenous Daily Jennelle Human B, NP   100 mg at 09/13/17 1022    Musculoskeletal: Strength & Muscle Tone: within normal limits Gait & Station: unable to stand Patient leans: N/A  Psychiatric Specialty Exam: Physical Exam  Psychiatric: His speech is normal. His mood appears  anxious. He is slowed and withdrawn. Cognition and memory are normal. He expresses impulsivity. He exhibits a depressed mood. He expresses suicidal ideation. He expresses suicidal plans.    Review of Systems  Constitutional: Positive for malaise/fatigue.  HENT: Negative.   Eyes: Negative.   Gastrointestinal: Positive for nausea.  Musculoskeletal: Positive for myalgias.  Skin: Negative.   Neurological: Positive for tremors.  Psychiatric/Behavioral: Positive for depression, substance abuse and suicidal ideas. The patient is nervous/anxious.     Blood pressure 120/75, pulse 85, temperature 99.4 F (37.4 C), temperature source Oral, resp. rate 18, height 6' (1.829 m), weight 100.6 kg (221 lb 12.5 oz), SpO2 96 %.Body mass index is 30.08 kg/m.  General Appearance: Casual  Eye Contact:  Fair  Speech:  Slow  Volume:  Decreased  Mood:  Depressed and Dysphoric  Affect:  Constricted  Thought Process:  Coherent and Linear  Orientation:  Full (Time, Place, and Person)  Thought Content:  Logical  Suicidal Thoughts:  Yes.  without intent/plan  Homicidal Thoughts:  No  Memory:  Immediate;   Good Recent;   Good Remote;   Good  Judgement:  Poor  Insight:  Shallow  Psychomotor Activity:  Psychomotor Retardation  Concentration:  Concentration: Fair and Attention Span: Fair  Recall:  Good  Fund of Knowledge:  Good   Language:  Good  Akathisia:  No  Handed:  Right  AIMS (if indicated):     Assets:  Communication Skills Desire for Improvement Social Support  ADL's:  Intact  Cognition:  WNL  Sleep:   fair     Treatment Plan Summary: Daily contact with patient to assess and evaluate symptoms and progress in treatment and Medication management  -Continue 1:1 sitter for safety. -Monitor patient for alcohol withdrawal. -Start Gabapentin 300 mg twice daily for alcohol withdrawal/mood -Consider Involuntary commitment if patient refuse to be admitted to inpatient psychiatric facility voluntarily.  Disposition: Recommend psychiatric Inpatient admission when medically cleared. Unit Social worker to assit with placing patient in a psychiatric hospital.  Corena Pilgrim, MD 09/13/2017 3:57 PM

## 2017-09-13 NOTE — Progress Notes (Signed)
CRITICAL VALUE ALERT  Critical Value:  Lactic acid 2.8  Date & Time Notied:  09/13/17 0328  Provider Notified: Pola Corn  Orders Received/Actions taken:

## 2017-09-13 NOTE — Progress Notes (Signed)
RT called this RN to the patient's bedside due to patient's increased agitation while suctioning through his ETT. This RN "maxed out" his propofol infusion at 50 mcg/kg/min and gave PRN fentanyl 100 mcg IV push. Unfortunately, the patient was able to dislodge the EET with his tongue. Elink notified and Jeraldine Loots, MD came to bedside to evaluate patient. The patient's sedation was turned off, the ETT was removed by RT, and a NRB was placed. The patient was initially very sleep, and was able to state his name and date of birth. Over the course of an hour, post-extubation, the patient became more alert, followed commands, and asked what happened and which hospital he is in. This RN gave a basic and brief explanation on why he was brought to the hospital. The patient looked shocked and said, "I am not suicidal. I would never do that." The is a suicide sitter at bedside. Will continue to monitor.

## 2017-09-14 ENCOUNTER — Encounter (HOSPITAL_COMMUNITY): Payer: Self-pay | Admitting: *Deleted

## 2017-09-14 ENCOUNTER — Encounter (HOSPITAL_COMMUNITY): Payer: Self-pay

## 2017-09-14 ENCOUNTER — Inpatient Hospital Stay (HOSPITAL_COMMUNITY)
Admission: AD | Admit: 2017-09-14 | Discharge: 2017-09-17 | DRG: 885 | Disposition: A | Discharge status: Home / Self Care | Payer: No Typology Code available for payment source | Source: Intra-hospital | Attending: Psychiatry | Admitting: Psychiatry

## 2017-09-14 ENCOUNTER — Other Ambulatory Visit: Payer: Self-pay

## 2017-09-14 ENCOUNTER — Inpatient Hospital Stay (HOSPITAL_COMMUNITY): Payer: BLUE CROSS/BLUE SHIELD

## 2017-09-14 DIAGNOSIS — F1490 Cocaine use, unspecified, uncomplicated: Secondary | ICD-10-CM | POA: Diagnosis present

## 2017-09-14 DIAGNOSIS — F332 Major depressive disorder, recurrent severe without psychotic features: Secondary | ICD-10-CM | POA: Diagnosis not present

## 2017-09-14 DIAGNOSIS — Z9189 Other specified personal risk factors, not elsewhere classified: Secondary | ICD-10-CM

## 2017-09-14 DIAGNOSIS — F1020 Alcohol dependence, uncomplicated: Secondary | ICD-10-CM

## 2017-09-14 DIAGNOSIS — F329 Major depressive disorder, single episode, unspecified: Secondary | ICD-10-CM | POA: Diagnosis present

## 2017-09-14 DIAGNOSIS — Z915 Personal history of self-harm: Secondary | ICD-10-CM | POA: Diagnosis not present

## 2017-09-14 DIAGNOSIS — S0081XA Abrasion of other part of head, initial encounter: Secondary | ICD-10-CM | POA: Diagnosis present

## 2017-09-14 DIAGNOSIS — R000 Tachycardia, unspecified: Secondary | ICD-10-CM | POA: Diagnosis present

## 2017-09-14 DIAGNOSIS — X58XXXA Exposure to other specified factors, initial encounter: Secondary | ICD-10-CM | POA: Diagnosis present

## 2017-09-14 DIAGNOSIS — Z79899 Other long term (current) drug therapy: Secondary | ICD-10-CM

## 2017-09-14 DIAGNOSIS — F17210 Nicotine dependence, cigarettes, uncomplicated: Secondary | ICD-10-CM | POA: Diagnosis not present

## 2017-09-14 DIAGNOSIS — F1190 Opioid use, unspecified, uncomplicated: Secondary | ICD-10-CM | POA: Diagnosis present

## 2017-09-14 DIAGNOSIS — F1290 Cannabis use, unspecified, uncomplicated: Secondary | ICD-10-CM | POA: Diagnosis present

## 2017-09-14 DIAGNOSIS — R450 Nervousness: Secondary | ICD-10-CM | POA: Diagnosis not present

## 2017-09-14 DIAGNOSIS — Z8659 Personal history of other mental and behavioral disorders: Secondary | ICD-10-CM

## 2017-09-14 DIAGNOSIS — F419 Anxiety disorder, unspecified: Secondary | ICD-10-CM | POA: Diagnosis present

## 2017-09-14 DIAGNOSIS — F10239 Alcohol dependence with withdrawal, unspecified: Secondary | ICD-10-CM | POA: Diagnosis present

## 2017-09-14 DIAGNOSIS — T1491XA Suicide attempt, initial encounter: Secondary | ICD-10-CM | POA: Diagnosis not present

## 2017-09-14 DIAGNOSIS — T424X2A Poisoning by benzodiazepines, intentional self-harm, initial encounter: Secondary | ICD-10-CM | POA: Diagnosis not present

## 2017-09-14 DIAGNOSIS — F1011 Alcohol abuse, in remission: Secondary | ICD-10-CM | POA: Diagnosis present

## 2017-09-14 LAB — GLUCOSE, CAPILLARY
GLUCOSE-CAPILLARY: 179 mg/dL — AB (ref 65–99)
GLUCOSE-CAPILLARY: 114 mg/dL — AB (ref 65–99)
Glucose-Capillary: 112 mg/dL — ABNORMAL HIGH (ref 65–99)
Glucose-Capillary: 134 mg/dL — ABNORMAL HIGH (ref 65–99)
Glucose-Capillary: 136 mg/dL — ABNORMAL HIGH (ref 65–99)

## 2017-09-14 LAB — BASIC METABOLIC PANEL
Anion gap: 8 (ref 5–15)
BUN: 8 mg/dL (ref 6–20)
CHLORIDE: 104 mmol/L (ref 101–111)
CO2: 26 mmol/L (ref 22–32)
Calcium: 8 mg/dL — ABNORMAL LOW (ref 8.9–10.3)
Creatinine, Ser: 0.96 mg/dL (ref 0.61–1.24)
GFR calc Af Amer: >60 mL/min (ref >60)
GFR calc non Af Amer: >60 mL/min (ref >60)
GLUCOSE: 140 mg/dL — AB (ref 65–99)
POTASSIUM: 3.5 mmol/L (ref 3.5–5.1)
Sodium: 138 mmol/L (ref 135–145)

## 2017-09-14 LAB — CBC
HCT: 46.5 % (ref 39.0–52.0)
Hemoglobin: 15.5 g/dL (ref 13.0–17.0)
MCH: 31.3 pg (ref 26.0–34.0)
MCHC: 33.3 g/dL (ref 30.0–36.0)
MCV: 93.8 fL (ref 78.0–100.0)
Platelets: 199 10*3/uL (ref 150–400)
RBC: 4.96 MIL/uL (ref 4.22–5.81)
RDW: 14.8 % (ref 11.5–15.5)
WBC: 14.3 10*3/uL — ABNORMAL HIGH (ref 4.0–10.5)

## 2017-09-14 LAB — MAGNESIUM: Magnesium: 2 mg/dL (ref 1.7–2.4)

## 2017-09-14 MED ORDER — MAGNESIUM HYDROXIDE 400 MG/5ML PO SUSP: 30.0000 mL | Freq: Every day | ORAL | Status: DC | PRN

## 2017-09-14 MED ORDER — FAMOTIDINE 20 MG PO TABS
20.0000 mg | ORAL_TABLET | Freq: Two times a day (BID) | ORAL | Status: DC
  Administered 2017-09-14: 20 mg via ORAL
  Filled 2017-09-14 (×9): qty 1

## 2017-09-14 MED ORDER — VITAMIN B-1 100 MG PO TABS
100.0000 mg | ORAL_TABLET | Freq: Every day | ORAL | Status: DC
  Filled 2017-09-14 (×4): qty 1

## 2017-09-14 MED ORDER — VITAMIN B-1 100 MG PO TABS: 100.0000 mg | ORAL_TABLET | Freq: Every day | ORAL | Status: DC

## 2017-09-14 MED ORDER — FOLIC ACID 1 MG PO TABS
1.0000 mg | ORAL_TABLET | Freq: Every day | ORAL | Status: DC
  Filled 2017-09-14 (×4): qty 1

## 2017-09-14 MED ORDER — THIAMINE HCL 100 MG PO TABS: 100.0000 mg | ORAL_TABLET | Freq: Every day | ORAL | Status: DC

## 2017-09-14 MED ORDER — FOLIC ACID 1 MG PO TABS: 1.0000 mg | ORAL_TABLET | Freq: Every day | ORAL | Status: DC

## 2017-09-14 MED ORDER — ADULT MULTIVITAMIN W/MINERALS CH
1.0000 | ORAL_TABLET | Freq: Every day | ORAL | Status: DC
Start: 2017-09-15 — End: 2018-05-06

## 2017-09-14 MED ORDER — GABAPENTIN 300 MG PO CAPS: 300.0000 mg | ORAL_CAPSULE | Freq: Two times a day (BID) | ORAL | Status: DC

## 2017-09-14 MED ORDER — GABAPENTIN 300 MG PO CAPS
300.0000 mg | ORAL_CAPSULE | Freq: Two times a day (BID) | ORAL | Status: DC
  Administered 2017-09-14: 300 mg via ORAL
  Filled 2017-09-14 (×5): qty 1

## 2017-09-14 NOTE — Progress Notes (Signed)
CSW informed by MD that patient is medically stable for DC- referral made to [facility]- they are reviewing for possible admission.  CSW will continue to follow  Burna Sis, LCSW Clinical Social Worker [phone]

## 2017-09-14 NOTE — Progress Notes (Signed)
Patient seen and examined. Full progress note/discharge summary to follow. Patient currently stable. No withdrawal symptoms. Medically stable for discharge to behavioral health.  Jacquelin Hawking, MD [facility] 09/14/2017, 11:05 AM Pager: [phone]

## 2017-09-14 NOTE — Tx Team (Signed)
Initial Treatment Plan 09/14/2017 7:18 PM Billy Lawrence JXB:[id number]    PATIENT STRESSORS: Marital or family conflict Substance abuse   PATIENT STRENGTHS: Ability for insight Average or above average intelligence Capable of independent living General fund of knowledge Motivation for treatment/growth   PATIENT IDENTIFIED PROBLEMS: Depression Suicidal thoughts Alcohol Abuse "Do better about drinking"                     DISCHARGE CRITERIA:  Ability to meet basic life and health needs Improved stabilization in mood, thinking, and/or behavior Reduction of life-threatening or endangering symptoms to within safe limits Verbal commitment to aftercare and medication compliance  PRELIMINARY DISCHARGE PLAN: Attend aftercare/continuing care group Return to previous living arrangement  PATIENT/FAMILY INVOLVEMENT: This treatment plan has been presented to and reviewed with the patient, Billy Lawrence, and/or family member, .  The patient and family have been given the opportunity to ask questions and make suggestions.  Marbeth Smedley, [place], [place] 09/14/2017, 7:18 PM

## 2017-09-14 NOTE — Discharge Summary (Signed)
Physician Discharge Summary  Billy Lawrence KGM:[id number] DOB: 1971-02-28 DOA: 09/12/2017  PCP: Patient, No Pcp Per  Admit date: 09/12/2017 Discharge date: 09/14/2017  Admitted From: Home Disposition: Inpatient behavioral health  Recommendations for Outpatient Follow-up:  1. Inpatient behavioral health   Discharge Condition: Stable medically CODE STATUS: Full code Diet recommendation: Regular diet   Brief/Interim Summary:  Admission HPI written by Gigi Gin, MD   CHIEF COMPLAINT:  Acute encephalopathy  HISTORY OF PRESENT ILLNESS:   HPI obtained from medical chart review and from family at bedside as patient is intubated and sedated on mechanical ventilation.    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  No other known PMH or daily medications known.    Ex-wife and their two minor kids (14 and 15) at the bedside; they have an older daughter, 17 yo but is estranged from her father.  His 56 year old daughter states she received a concerning text from her father, therefore his ex-wife called him and he said "dont call the police" and that he wanted to "end it all".   Patient's other ex-wife died in 2015/09/14 and recently his girlfriend broke up with him.  EMS found the patient with empty bottles of xanax 1 mg prescribed to his deceased's ex-wife in 14-Sep-2015. Suspected up to 20 pills ingested.   Patient arrived in ER minimally responsive with no response to narcan, requiring intubation for airway protection.  No vomiting in ER and OGT with minimal clear output.  Labs noted for Lactate 2.85 increasing to 3.08, WBC 12.3, Hgb 17.4, glucose 124, neg troponin, sCr 1.15, K 3.5, ETOH 208, UDS positive for benzos, negative acetaminophen and salicylate levels.  Concern over head trauma due to forehead abrasion, CT head and cervical negative.  Required propofol in ER for some agitation.  PCCM called for admit.      Hospital course:  Patient was intubated from May 4 to May 5.  He self extubated.  Chest x-ray was significant for atelectasis.  Mental status improved.  Psychiatry evaluated patient and recommended inpatient behavioral health admission.  Recommended gabapentin for alcohol withdrawal.  No evidence of alcohol withdrawal symptoms prior to discharge.  Started on multivitamin, folic acid and thiamine.  Discharge Diagnoses:  Principal Problem:   Intentional benzodiazepine overdose (HCC) Active Problems:   Acute encephalopathy   Major depressive disorder, recurrent episode, severe (HCC)   Alcohol use disorder, severe, dependence (HCC)   Suicidal ideation    Discharge Instructions  Discharge Instructions    Diet - low sodium heart healthy   Complete by:  As directed    Increase activity slowly   Complete by:  As directed      Allergies as of 09/14/2017   No Known Allergies     Medication List    TAKE these medications   folic acid 1 MG tablet Commonly known as:  FOLVITE Take 1 tablet (1 mg total) by mouth daily. Start taking on:  09/15/2017   gabapentin 300 MG capsule Commonly known as:  NEURONTIN Take 1 capsule (300 mg total) by mouth 2 (two) times daily.   multivitamin with minerals Tabs tablet Take 1 tablet by mouth daily. Start taking on:  09/15/2017   thiamine 100 MG tablet Take 1 tablet (100 mg total) by mouth daily. Start taking on:  09/15/2017      Follow-up Information    [facility]  Follow up.   Contact information: [address] [place] [address] [phone]         No Known Allergies  Consultations:  Critical care medicine  Psychiatry   Procedures/Studies: Ct Head Wo Contrast  Result Date: 09/12/2017 CLINICAL DATA:  47 year old male with head trauma. EXAM: CT HEAD WITHOUT CONTRAST CT CERVICAL SPINE WITHOUT CONTRAST TECHNIQUE: Multidetector CT imaging of the head and cervical spine was  performed following the standard protocol without intravenous contrast. Multiplanar CT image reconstructions of the cervical spine were also generated. COMPARISON:  None. FINDINGS: CT HEAD FINDINGS Brain: No evidence of acute infarction, hemorrhage, hydrocephalus, extra-axial collection or mass lesion/mass effect. Vascular: No hyperdense vessel or unexpected calcification. Skull: Normal. Negative for fracture or focal lesion. Sinuses/Orbits: Mild diffuse mucoperiosteal thickening of paranasal sinuses. No air-fluid levels. The mastoid air cells are clear. Cerumen noted in the external auditory canals bilaterally. Other: None CT CERVICAL SPINE FINDINGS Alignment: No acute subluxation. Skull base and vertebrae: No acute fracture. No primary bone lesion or focal pathologic process. Soft tissues and spinal canal: No prevertebral fluid or swelling. No visible canal hematoma. Disc levels:  Mild degenerative changes primarily at C5-C6. Upper chest: Negative. Other: An endotracheal and an enteric tube are partially visualized. IMPRESSION: 1. No acute intracranial pathology. 2. No acute/traumatic cervical spine pathology. Electronically Signed   By: Elgie Collard M.D.   On: 09/12/2017 22:37   Ct Cervical Spine Wo Contrast  Result Date: 09/12/2017 CLINICAL DATA:  47 year old male with head trauma. EXAM: CT HEAD WITHOUT CONTRAST CT CERVICAL SPINE WITHOUT CONTRAST TECHNIQUE: Multidetector CT imaging of the head and cervical spine was performed following the standard protocol without intravenous contrast. Multiplanar CT image reconstructions of the cervical spine were also generated. COMPARISON:  None. FINDINGS: CT HEAD FINDINGS Brain: No evidence of acute infarction, hemorrhage, hydrocephalus, extra-axial collection or mass lesion/mass effect. Vascular: No hyperdense vessel or unexpected calcification. Skull: Normal. Negative for fracture or focal lesion. Sinuses/Orbits: Mild diffuse mucoperiosteal thickening of paranasal  sinuses. No air-fluid levels. The mastoid air cells are clear. Cerumen noted in the external auditory canals bilaterally. Other: None CT CERVICAL SPINE FINDINGS Alignment: No acute subluxation. Skull base and vertebrae: No acute fracture. No primary bone lesion or focal pathologic process. Soft tissues and spinal canal: No prevertebral fluid or swelling. No visible canal hematoma. Disc levels:  Mild degenerative changes primarily at C5-C6. Upper chest: Negative. Other: An endotracheal and an enteric tube are partially visualized. IMPRESSION: 1. No acute intracranial pathology. 2. No acute/traumatic cervical spine pathology. Electronically Signed   By: Elgie Collard M.D.   On: 09/12/2017 22:37   Dg Chest Port 1 View  Result Date: 09/14/2017 CLINICAL DATA:  Respiratory failure EXAM: PORTABLE CHEST 1 VIEW COMPARISON:  09/13/2017 FINDINGS: Cardiac shadow is stable. Endotracheal tube and nasogastric catheter have been removed in the interval. Some mild left basilar atelectasis/infiltrate is seen. No bony abnormality is noted. IMPRESSION: Interval removal of tubes and lines. Mild left basilar atelectasis is noted. Electronically Signed   By: Alcide Clever M.D.   On: 09/14/2017 07:50   Dg Chest Port 1 View  Result Date: 09/13/2017 CLINICAL DATA:  47 year old male post intubation EXAM: PORTABLE CHEST 1 VIEW COMPARISON:  .  Chest radiograph dated 09/12/2017 FINDINGS: There has been interval retraction of the endotracheal tube with tip approximately 2.7 cm above the carina. An enteric tube extends into the left hemiabdomen with tip beyond the inferior margin of the image. There is a small left  pleural effusion with left lung base atelectasis. Infiltrate is not excluded. Clinical correlation is recommended. The right lung is clear. Mildly enlarged cardiac silhouette. No acute osseous pathology. IMPRESSION: 1. Interval retraction of the endotracheal tube with tip now approximately 2.7 cm above the carina. 2. Small left  pleural effusion and left lung base atelectasis versus infiltrate. 3. Mild cardiomegaly. Electronically Signed   By: Elgie Collard M.D.   On: 09/13/2017 00:58   Dg Chest Portable 1 View  Result Date: 09/12/2017 CLINICAL DATA:  Post intubation EXAM: PORTABLE CHEST 1 VIEW COMPARISON:  None. FINDINGS: Endotracheal tube tip is at the carina. Esophageal tube tip is below the diaphragm but non included. Right lung is clear. Patchy atelectasis at the left base. Cardiomediastinal silhouette within normal limits. No pneumothorax. IMPRESSION: 1. Endotracheal tube tip at the carina 2. Esophageal tube tip below the diaphragm but non included 3. Slight elevation of left diaphragm with left basilar atelectasis Electronically Signed   By: Jasmine Pang M.D.   On: 09/12/2017 21:05     Subjective: No issues today. No dyspnea.  Discharge Exam: Vitals:   09/14/17 1135 09/14/17 1200  BP:  123/88  Pulse:  76  Resp:  (!) 23  Temp: 98.7 F (37.1 C)   SpO2:  95%   Vitals:   09/14/17 0900 09/14/17 1002 09/14/17 1135 09/14/17 1200  BP:  134/84  123/88  Pulse: 70 81  76  Resp: (!) 22 17  (!) 23  Temp:   98.7 F (37.1 C)   TempSrc:   Oral   SpO2: 95% 96%  95%  Weight:      Height:        General: Pt is alert, awake, not in acute distress Cardiovascular: RRR, S1/S2 +, no rubs, no gallops Respiratory: CTA bilaterally, no wheezing, no rhonchi Abdominal: Soft, NT, ND, bowel sounds + Extremities: no edema, no cyanosis Psych: no suicidal ideation. Normal mood with slightly flat affect    The results of significant diagnostics from this hospitalization (including imaging, microbiology, ancillary and laboratory) are listed below for reference.     Microbiology: Recent Results (from the past 240 hour(s))  MRSA PCR Screening     Status: None   Collection Time: 09/12/17 11:35 PM  Result Value Ref Range Status   MRSA by PCR NEGATIVE NEGATIVE Final    Comment:        The GeneXpert MRSA Assay  (FDA approved for NASAL specimens only), is one component of a comprehensive MRSA colonization surveillance program. It is not intended to diagnose MRSA infection nor to guide or monitor treatment for MRSA infections. Performed at [facility], [address], [place], [place] [address]   Culture, respiratory (NON-Expectorated)     Status: None (Preliminary result)   Collection Time: 09/13/17  4:36 AM  Result Value Ref Range Status   Specimen Description TRACHEAL ASPIRATE  Final   Special Requests NONE  Final   Gram Stain   Final    ABUNDANT WBC PRESENT, PREDOMINANTLY PMN RARE SQUAMOUS EPITHELIAL CELLS PRESENT ABUNDANT GRAM POSITIVE COCCI IN CHAINS IN CLUSTERS MODERATE GRAM NEGATIVE RODS FEW GRAM POSITIVE RODS    Culture   Final    CULTURE REINCUBATED FOR BETTER GROWTH Performed at [facility], [address], [place], [place] [address]    Report Status PENDING  Incomplete     Labs: BNP (last 3 results) No results for input(s): BNP in the last 8760 hours. Basic Metabolic Panel: Recent Labs  Lab 09/12/17 2047 09/12/17 2051 09/12/17 2229 09/13/17 0236 09/14/17 0336  NA 142 140  --  142 138  K 3.2* 3.5  --  3.8 3.5  CL 106 105  --  104 104  CO2  --  21*  --  21* 26  GLUCOSE 122* 124*  --  62* 140*  BUN 8 8  --  8 8  CREATININE 1.30* 1.15  --  0.94 0.96  CALCIUM  --  8.7*  --  8.6* 8.0*  MG  --   --  1.8 1.6* 2.0  PHOS  --   --  3.5 3.3  --    Liver Function Tests: Recent Labs  Lab 09/12/17 2051  AST 33  ALT 26  ALKPHOS 60  BILITOT 0.7  PROT 6.5  ALBUMIN 4.0   No results for input(s): LIPASE, AMYLASE in the last 168 hours. No results for input(s): AMMONIA in the last 168 hours. CBC: Recent Labs  Lab 09/12/17 2047 09/12/17 2051 09/13/17 0236 09/14/17 0336  WBC  --  12.3* 21.1* 14.3*  NEUTROABS  --  7.9*  --   --   HGB 17.7* 17.4* 17.4* 15.5  HCT 52.0 51.7 51.0 46.5  MCV  --  93.8 93.2 93.8  PLT  --  233 237 199   Cardiac  Enzymes: Recent Labs  Lab 09/13/17 0236  TROPONINI <0.03   BNP: Invalid input(s): POCBNP CBG: Recent Labs  Lab 09/13/17 2017 09/14/17 0033 09/14/17 0437 09/14/17 0749 09/14/17 1124  GLUCAP 122* 136* 134* 112* 179*   D-Dimer No results for input(s): DDIMER in the last 72 hours. Hgb A1c No results for input(s): HGBA1C in the last 72 hours. Lipid Profile Recent Labs    09/12/17 2229  TRIG 178*   Thyroid function studies No results for input(s): TSH, T4TOTAL, T3FREE, THYROIDAB in the last 72 hours.  Invalid input(s): FREET3 Anemia work up No results for input(s): VITAMINB12, FOLATE, FERRITIN, TIBC, IRON, RETICCTPCT in the last 72 hours. Urinalysis    Component Value Date/Time   COLORURINE YELLOW 09/12/2017 2118   APPEARANCEUR CLEAR 09/12/2017 2118   LABSPEC 1.005 09/12/2017 2118   PHURINE 5.0 09/12/2017 2118   GLUCOSEU NEGATIVE 09/12/2017 2118   HGBUR SMALL (A) 09/12/2017 2118   BILIRUBINUR NEGATIVE 09/12/2017 2118   KETONESUR NEGATIVE 09/12/2017 2118   PROTEINUR NEGATIVE 09/12/2017 2118   NITRITE NEGATIVE 09/12/2017 2118   LEUKOCYTESUR NEGATIVE 09/12/2017 2118   Sepsis Labs Invalid input(s): PROCALCITONIN,  WBC,  LACTICIDVEN Microbiology Recent Results (from the past 240 hour(s))  MRSA PCR Screening     Status: None   Collection Time: 09/12/17 11:35 PM  Result Value Ref Range Status   MRSA by PCR NEGATIVE NEGATIVE Final    Comment:        The GeneXpert MRSA Assay (FDA approved for NASAL specimens only), is one component of a comprehensive MRSA colonization surveillance program. It is not intended to diagnose MRSA infection nor to guide or monitor treatment for MRSA infections. Performed at [facility], [address] [place], [place] [address]   Culture, respiratory (NON-Expectorated)     Status: None (Preliminary result)   Collection Time: 09/13/17  4:36 AM  Result Value Ref Range Status   Specimen Description TRACHEAL ASPIRATE  Final    Special Requests NONE  Final   Gram Stain   Final    ABUNDANT WBC PRESENT, PREDOMINANTLY PMN RARE SQUAMOUS EPITHELIAL CELLS PRESENT ABUNDANT GRAM POSITIVE COCCI IN CHAINS IN CLUSTERS MODERATE  GRAM NEGATIVE RODS FEW GRAM POSITIVE RODS    Culture   Final    CULTURE REINCUBATED FOR BETTER GROWTH Performed at [facility], [address] [place], [place] [address]    Report Status PENDING  Incomplete     SIGNED:   Jacquelin Hawking, MD [facility] 09/14/2017, 2:02 PM

## 2017-09-14 NOTE — Progress Notes (Signed)
Patient will discharge to [facility] Anticipated discharge date:5/6 Family notified: pt to notify Transportation by [place] scheduled for 5:45pm Report [phone] must be called prior to patient leaving the hospital Pt going to Rm 306 bed 2  Voluntary form completed with patient and placed on chart- copy faxed to [facility] and fax confirmation received  If any issues with discharge after 4:30pm please call evening ED CSW at [phone]  CSW signing off.  Burna Sis, LCSW Clinical Social Worker [phone]

## 2017-09-14 NOTE — Progress Notes (Signed)
Billy Lawrence is a 47 year old male pt admitted on voluntary basis from medical floor after intentional overdose. On admission, he reports that he is feeling better and denies any SI on admission. He reports that he was drinking and make a bad decision and reports that he is really not sure why he did it. He denies ever being hospitalized before, denies any medical issues and reports that he is not on any medications. He does endorse that he drinks a little too much. He reports that the xanax was his deceased wife's and reports that he took it out of the safe, He denies any SI on admission and is able to contract for safety while in the hospital. He reports that he lives alone and reports that he will go to the same living situation upon discharge. [place] was oriented to the unit and safety maintained.

## 2017-09-14 NOTE — Plan of Care (Signed)
AA+Ox4. Sleepy, flat affect. VS WNL. Transfer orders in place. Awaiting bed for tele vs. IP psych.

## 2017-09-14 NOTE — Progress Notes (Signed)
09/14/17  1440 hrs: Report given to [facility], RN at [facility] at this time.

## 2017-09-14 NOTE — Progress Notes (Signed)
CSW received consult for inpatient psychiatric placement- CSW cannot initiate referral process until it is documented that patient is medically cleared.  Cannot require IV medications or physical assistance.  CSW paged MD to inform and inquire about medical stability  Burna Sis, LCSW Clinical Social Worker [phone]

## 2017-09-14 NOTE — Care Management Note (Signed)
Case Management Note  Patient Details  Name: Billy Lawrence MRN: [id number] Date of Birth: 1971-01-11  Subjective/Objective:   Admitted for Major Depressive disorder.              Action/Plan: Patient with discharge orders home today.  NCM discussed new medication prescribed and cost.  NCM provided patient with GoodRx discount card to assist with cost.  Patient states he can pay for $12.  Expected Discharge Date:  09/14/17               Expected Discharge Plan:  [facility]  In-House Referral:  Clinical Social Work, Software engineer  CM Consult  Status of Service:  Completed, signed off  Yancey Flemings, RN 09/14/2017, 2:45 PM

## 2017-09-14 NOTE — Progress Notes (Signed)
Chaplain visited PT but sleep at the time, will revisit in order to sit and speak with PT.

## 2017-09-15 DIAGNOSIS — T424X2A Poisoning by benzodiazepines, intentional self-harm, initial encounter: Secondary | ICD-10-CM

## 2017-09-15 DIAGNOSIS — F1020 Alcohol dependence, uncomplicated: Secondary | ICD-10-CM

## 2017-09-15 DIAGNOSIS — T1491XA Suicide attempt, initial encounter: Secondary | ICD-10-CM

## 2017-09-15 DIAGNOSIS — Z630 Problems in relationship with spouse or partner: Secondary | ICD-10-CM

## 2017-09-15 DIAGNOSIS — F17210 Nicotine dependence, cigarettes, uncomplicated: Secondary | ICD-10-CM

## 2017-09-15 DIAGNOSIS — F332 Major depressive disorder, recurrent severe without psychotic features: Principal | ICD-10-CM

## 2017-09-15 DIAGNOSIS — Z634 Disappearance and death of family member: Secondary | ICD-10-CM

## 2017-09-15 LAB — CULTURE, RESPIRATORY W GRAM STAIN

## 2017-09-15 LAB — CULTURE, RESPIRATORY: CULTURE: NORMAL

## 2017-09-15 MED ORDER — LORAZEPAM 1 MG PO TABS: 1.0000 mg | ORAL_TABLET | Freq: Three times a day (TID) | ORAL | Status: AC

## 2017-09-15 MED ORDER — LORAZEPAM 1 MG PO TABS: 1.0000 mg | ORAL_TABLET | Freq: Every day | ORAL | Status: DC

## 2017-09-15 MED ORDER — ADULT MULTIVITAMIN W/MINERALS CH
1.0000 | ORAL_TABLET | Freq: Every day | ORAL | Status: DC
  Filled 2017-09-15 (×4): qty 1

## 2017-09-15 MED ORDER — LORAZEPAM 1 MG PO TABS: 1.0000 mg | ORAL_TABLET | Freq: Four times a day (QID) | ORAL | Status: AC

## 2017-09-15 MED ORDER — HYDROXYZINE HCL 25 MG PO TABS
25.0000 mg | ORAL_TABLET | Freq: Four times a day (QID) | ORAL | Status: DC | PRN
  Filled 2017-09-15: qty 10

## 2017-09-15 MED ORDER — ONDANSETRON 4 MG PO TBDP: 4.0000 mg | ORAL_TABLET | Freq: Four times a day (QID) | ORAL | Status: DC | PRN

## 2017-09-15 MED ORDER — GABAPENTIN 300 MG PO CAPS
300.0000 mg | ORAL_CAPSULE | Freq: Two times a day (BID) | ORAL | Status: DC | PRN
  Filled 2017-09-15: qty 14

## 2017-09-15 MED ORDER — LORAZEPAM 1 MG PO TABS: 1.0000 mg | ORAL_TABLET | Freq: Four times a day (QID) | ORAL | Status: DC | PRN

## 2017-09-15 MED ORDER — NICOTINE POLACRILEX 2 MG MT GUM
2.0000 mg | CHEWING_GUM | OROMUCOSAL | Status: DC | PRN
  Administered 2017-09-15 – 2017-09-17 (×5): 2 mg via ORAL
  Filled 2017-09-15: qty 1

## 2017-09-15 MED ORDER — LORAZEPAM 1 MG PO TABS: 1.0000 mg | ORAL_TABLET | Freq: Two times a day (BID) | ORAL | Status: DC

## 2017-09-15 MED ORDER — THIAMINE HCL 100 MG/ML IJ SOLN: 100.0000 mg | Freq: Once | INTRAMUSCULAR | Status: DC

## 2017-09-15 MED ORDER — VITAMIN B-1 100 MG PO TABS
100.0000 mg | ORAL_TABLET | Freq: Every day | ORAL | Status: DC
  Filled 2017-09-15 (×3): qty 1

## 2017-09-15 MED ORDER — LOPERAMIDE HCL 2 MG PO CAPS: 2.0000 mg | ORAL_CAPSULE | ORAL | Status: DC | PRN

## 2017-09-15 NOTE — BHH Counselor (Signed)
Adult Comprehensive Assessment  Patient ID: Billy Lawrence, male   DOB: November 02, 1970, 47 y.o.   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  Family Relationships: Patient reports his wife passed away last year.  Financial / Lack of resources (include bankruptcy): Patient denies any stressors Housing / Lack of housing: Patient reports living in a home alone in [place].  Physical health (include injuries & life threatening diseases): [place] denies any stressors  Social relationships: Patient reports having a strained relationship with his current girlfriend. He states that he took the Xanax to get more attention from his girlfriend.  Substance abuse: Patient repotrs drinking alcohol at least 3x a week. He reports he recently took his deceased wife Xanax. Patient denies being addiced to any substance.  Bereavement / Loss: Patient reports his wife passed away last 03/25/2023. Reports he is still struggling with her death.   Living/Environment/Situation:  Living Arrangements: Alone Living conditions (as described by patient or guardian): "Good" How long has patient lived in current situation?: 6 months  What is atmosphere in current home: Comfortable  Family History:  Marital status: Single(Patient reports being in a relationship with his current girlfriend for a few months) Widowed, when?: Since 11/13/18Are you sexually active?: Yes What is your sexual orientation?: Heterosexual  Has your sexual activity been affected by drugs, alcohol, medication, or emotional stress?: No Does patient have children?: Yes How many children?: 3 How is patient's relationship with their children?: Patient reports having a good realationship with his children.   Childhood History:  By whom was/is the patient raised?: Both  parents Description of patient's relationship with caregiver when they were a child: Patient reports having a good relationship with his parents during his childhood.  Patient's description of current relationship with people who raised him/her: Patient reports having a "wonderful" relationship with his parents currently.  How were you disciplined when you got in trouble as a child/adolescent?: Whoopings Does patient have siblings?: Yes Number of Siblings: 4 Description of patient's current relationship with siblings: Patient reports having a great relationship with his siblings Did patient suffer any verbal/emotional/physical/sexual abuse as a child?: No Did patient suffer from severe childhood neglect?: No Has patient ever been sexually abused/assaulted/raped as an adolescent or adult?: No Was the patient ever a victim of a crime or a disaster?: No Witnessed domestic violence?: No Has patient been effected by domestic violence as an adult?: No  Education:  Highest grade of school patient has completed: 11th grade Currently a student?: No Learning disability?: No  Employment/Work Situation:   Employment situation: Employed Where is patient currently employed?: [employer]  How long has patient been employed?: 6 months  What is the longest time patient has a held a job?: [employer] Where was the patient employed at that time?: 15 years  Has patient ever been in the [employer]?: No Has patient ever served in combat?: No Did You Receive Any Psychiatric Treatment/Services While in Equities trader?: No Are There Guns or Other Weapons in Your Home?: Yes Types of Guns/Weapons: Clinical cytogeneticist?: Yes  Financial Resources:   Financial resources: Income from employment Does patient have a representative payee or guardian?: No  Alcohol/Substance Abuse:   What has been your use of drugs/alcohol within the last 12  months?: Alcohol -  at least 3x a week If attempted suicide, did drugs/alcohol play a role in this?: No Alcohol/Substance Abuse Treatment Hx: Denies past history Has alcohol/substance abuse ever caused legal problems?: No  Social Support System:   Patient's Community Support System: Good Describe Community Support System: "My family" Type of faith/religion: Ephriam Knuckles  How does patient's faith help to cope with current illness?: Prayer  Leisure/Recreation:   Leisure and Hobbies: English as a second language teacher, Barrister's clerk, gun range"  Strengths/Needs:   What things does the patient do well?: "Im talented, im a Chief Executive Officer, good with my kids" In what areas does patient struggle / problems for patient: "Leave the alcohol alone"  Discharge Plan:   Does patient have access to transportation?: Yes Will patient be returning to same living situation after discharge?: Yes Currently receiving community mental health services: No If no, would patient like referral for services when discharged?: Yes (What county?)([place] ) Does patient have financial barriers related to discharge medications?: No  Summary/Recommendations:   Summary and Recommendations (to be completed by the evaluator): Billy Lawrence is a 47 year old male who presented to the hospital seeking treatment  for an intentional overdose. During the assessment, Billy Lawrence was pleasant and cooperative. Pamela reports that he did not intend to overdose. He reports that he and his current girlfriend  got into an argument, which led to her leaving the house. Billy Lawrence reports that he began drinking and then he decided to take Xanax as a way of getting his girlfriend's attention. Billy Lawrence denies having a drinking problem, however he has decided not to drink anymore for the sake of his relationship. Billy Lawrence declined any referrals for outpatient services and states that he would just like to "get back to work and enjoying my life with my family". Billy Lawrence can benefit from crisis stabilization, medication  management, therapeutic milieu and referral services.   Billy Lawrence. 09/15/2017

## 2017-09-15 NOTE — Progress Notes (Signed)
D:Pt has been interacting with peers on the unit and is focused on being discharged. Pt rates anxiety and depression as a 0. He denies any detox symptoms and refuses medications. Pt says that he was drinking and his OD was not intentional. He says that he would not have taken any pills if he had not been drinking. Pt reports that he wants to go home and get back to work. A:Supported pt to discuss feelings. Offered encouragement and 15 minute checks. R:Pt denies si and hi. Safety maintained on the unit.

## 2017-09-15 NOTE — H&P (Signed)
Psychiatric Admission Assessment Adult  Patient Identification: Billy Lawrence MRN:  [id number] Date of Evaluation:  09/15/2017 Chief Complaint:  MDD Alcohol use disorder  Principal Diagnosis: Major depressive disorder, recurrent episode, severe ([place]) Diagnosis:   Patient Active Problem List   Diagnosis Date Noted  . Major depressive disorder, recurrent episode, severe ([place]) [F33.2] 09/13/2017    Priority: High  . Alcohol use disorder, severe, dependence ([place]) [F10.20] 09/13/2017    Priority: High  . Intentional benzodiazepine overdose ([place]) [T42.4X2A] 09/14/2017  . Suicidal ideation [R45.851] 09/14/2017  . Acute encephalopathy [G93.40] 09/12/2017   History of Present Illness: Prior to admission:  patient and agree with plan as detailed by APP note. Briefly, this is a 47yoM with hx Etoh abuse and Illicit drug use (cocaine, heroin, and marijuana per his ex-wife), presents to the ER as an intentional drug overdose. Reportedly patient sent a text to his minor daughter stating that he wanted to die. She showed the text to her mother (patient's ex-wife) who then called the patient. Patient admitted to her at that time that he had taken xanax and that he wanted to die so she couldn't call an ambulance. When EMS arrived patient was minimally responsive and required intubation for airway protection. Reportedly patient has had a lot of life stressors recently, with his 2nd wife dying then more recently a girlfriend leaving him. Present in ER are patient's ex-wife and 2 minor children. He also has a 54 year old daughter who, per the ex-wife, has said she wants nothing to do with the patient. In that case patient's parents (who reportedly are both alive) would be the next of kin; patient is also said to have a brother. On exam patient is deeply sedated, no response to sternal rub. PERRL, Small abrasion on forehead. Lungs CTA b/l, Abd soft NTND. Patient's ex-wife thinks he took 20 tabs of 41m Xanax. This fits with  his UDS that is positive only for benzos. Etoh positive at 208. CXR on my review shows ETT at the [facility]; not clear if it was retracted already in Er. Will repeat CXR now. Obtain ABG and adjust vent as needed. EKG shows sinus tach @ 115 with Qtc 473 and QRS 104. Will need 1:1 sitter and psych consult following extubation.   Today, patient minimizes his actions and denies any depression along with suicidal ideations.  Denies withdrawal symptoms and adamant he wants no medications except his alcohol detox medications.  His finance, TGilmore Laroche returned to him after living him a couple of months ago.  When she left, he went to the liquor store and started his drinking downward spiral.  He had been clear for 7 months prior to this and many years in the past.  Over confidant in his ability to refrain from alcohol use but does have a good support system with his girlfriend and children.  Sleep and appetite are fair.  Pleasant and cooperative, engages easily in conversaion  Associated Signs/Symptoms: Depression Symptoms:  suicidal attempt, (Hypo) Manic Symptoms:  none Anxiety Symptoms:  none Psychotic Symptoms:  none PTSD Symptoms: NA Total Time spent with patient: 45 minutes  Past Psychiatric History: depression, substance abuse  Is the patient at risk to self? Yes.    Has the patient been a risk to self in the past 6 months? Yes.    Has the patient been a risk to self within the distant past? Yes.    Is the patient a risk to others? No.  Has the patient been a  risk to others in the past 6 months? No.  Has the patient been a risk to others within the distant past? No.   Prior Inpatient Therapy:  Yes Prior Outpatient Therapy:  Yes  Alcohol Screening: 1. How often do you have a drink containing alcohol?: 2 to 3 times a week 2. How many drinks containing alcohol do you have on a typical day when you are drinking?: 3 or 4 3. How often do you have six or more drinks on one occasion?: Monthly AUDIT-C  Score: 6 4. How often during the last year have you found that you were not able to stop drinking once you had started?: Less than monthly 5. How often during the last year have you failed to do what was normally expected from you becasue of drinking?: Less than monthly 6. How often during the last year have you needed a first drink in the morning to get yourself going after a heavy drinking session?: Never 7. How often during the last year have you had a feeling of guilt of remorse after drinking?: Weekly 8. How often during the last year have you been unable to remember what happened the night before because you had been drinking?: Less than monthly 9. Have you or someone else been injured as a result of your drinking?: No 10. Has a relative or friend or a doctor or another health worker been concerned about your drinking or suggested you cut down?: No Alcohol Use Disorder Identification Test Final Score (AUDIT): 12 Intervention/Follow-up: Alcohol Education Substance Abuse History in the last 12 months:  Yes.   Consequences of Substance Abuse: Family Consequences:  issues with relationships Previous Psychotropic Medications: No  Psychological Evaluations: Yes  Past Medical History: History reviewed. No pertinent past medical history. History reviewed. No pertinent surgical history. Family History: History reviewed. No pertinent family history. Family Psychiatric  History: none Tobacco Screening: Have you used any form of tobacco in the last 30 days? (Cigarettes, Smokeless Tobacco, Cigars, and/or Pipes): Yes Tobacco use, Select all that apply: 5 or more cigarettes per day Are you interested in Tobacco Cessation Medications?: No, patient refused Counseled patient on smoking cessation including recognizing danger situations, developing coping skills and basic information about quitting provided: Refused/Declined practical counseling Social History:  Social History   Substance and Sexual  Activity  Alcohol Use Yes     Social History   Substance and Sexual Activity  Drug Use Yes    Additional Social History: Marital status: Single(Patient reports being in a relationship with his current girlfriend for a few months) Widowed, when?: Since November 2018 Are you sexually active?: Yes What is your sexual orientation?: Heterosexual  Has your sexual activity been affected by drugs, alcohol, medication, or emotional stress?: No Does patient have children?: Yes How many children?: 3 How is patient's relationship with their children?: Patient reports having a good realationship with his children.     Allergies:  No Known Allergies Lab Results:  Results for orders placed or performed during the hospital encounter of 09/12/17 (from the past 48 hour(s))  Glucose, capillary     Status: Abnormal   Collection Time: 09/13/17 11:50 AM  Result Value Ref Range   Glucose-Capillary 109 (H) 65 - 99 mg/dL  Glucose, capillary     Status: None   Collection Time: 09/13/17  4:11 PM  Result Value Ref Range   Glucose-Capillary 90 65 - 99 mg/dL  Glucose, capillary     Status: Abnormal   Collection Time: 09/13/17  8:17 PM  Result Value Ref Range   Glucose-Capillary 122 (H) 65 - 99 mg/dL  Glucose, capillary     Status: Abnormal   Collection Time: 09/14/17 12:33 AM  Result Value Ref Range   Glucose-Capillary 136 (H) 65 - 99 mg/dL  Magnesium     Status: None   Collection Time: 09/14/17  3:36 AM  Result Value Ref Range   Magnesium 2.0 1.7 - 2.4 mg/dL    Comment: Performed at [facility], [place] [address] [place], [place] [address]  CBC     Status: Abnormal   Collection Time: 09/14/17  3:36 AM  Result Value Ref Range   WBC 14.3 (H) 4.0 - 10.5 K/uL   RBC 4.96 4.22 - 5.81 MIL/uL   Hemoglobin 15.5 13.0 - 17.0 g/dL   HCT 46.5 39.0 - 52.0 %   MCV 93.8 78.0 - 100.0 fL   MCH 31.3 26.0 - 34.0 pg   MCHC 33.3 30.0 - 36.0 g/dL   RDW 14.8 11.5 - 15.5 %   Platelets 199 150 - 400 K/uL     Comment: Performed at [facility], [place] [address] [place], [place] [address]  Basic metabolic panel     Status: Abnormal   Collection Time: 09/14/17  3:36 AM  Result Value Ref Range   Sodium 138 135 - 145 mmol/L   Potassium 3.5 3.5 - 5.1 mmol/L   Chloride 104 101 - 111 mmol/L   CO2 26 22 - 32 mmol/L   Glucose, Bld 140 (H) 65 - 99 mg/dL   BUN 8 6 - 20 mg/dL   Creatinine, Ser 0.96 0.61 - 1.24 mg/dL   Calcium 8.0 (L) 8.9 - 10.3 mg/dL   GFR calc non Af Amer >60 >60 mL/min   GFR calc Af Amer >60 >60 mL/min    Comment: (NOTE) The eGFR has been calculated using the CKD EPI equation. This calculation has not been validated in all clinical situations. eGFR's persistently <60 mL/min signify possible Chronic Kidney Disease.    Anion gap 8 5 - 15    Comment: Performed at [facility] [address] [place], [place] [address]  Glucose, capillary     Status: Abnormal   Collection Time: 09/14/17  4:37 AM  Result Value Ref Range   Glucose-Capillary 134 (H) 65 - 99 mg/dL  Glucose, capillary     Status: Abnormal   Collection Time: 09/14/17  7:49 AM  Result Value Ref Range   Glucose-Capillary 112 (H) 65 - 99 mg/dL  Glucose, capillary     Status: Abnormal   Collection Time: 09/14/17 11:24 AM  Result Value Ref Range   Glucose-Capillary 179 (H) 65 - 99 mg/dL   Comment 1 Notify RN   Glucose, capillary     Status: Abnormal   Collection Time: 09/14/17  3:01 PM  Result Value Ref Range   Glucose-Capillary 114 (H) 65 - 99 mg/dL    Blood Alcohol level:  Lab Results  Component Value Date   ETH 208 (H) 28/76/8115    Metabolic Disorder Labs:  No results found for: HGBA1C, MPG No results found for: PROLACTIN Lab Results  Component Value Date   TRIG 178 (H) 09/12/2017    Current Medications: Current Facility-Administered Medications  Medication Dose Route Frequency Provider Last Rate Last Dose  . famotidine (PEPCID) tablet 20 mg  20 mg Oral BID Elmarie Shiley A, NP   20 mg at  09/14/17 2002  . folic acid (FOLVITE)  tablet 1 mg  1 mg Per Tube Daily Elmarie Shiley A, NP      . gabapentin (NEURONTIN) capsule 300 mg  300 mg Oral BID Elmarie Shiley A, NP   300 mg at 09/14/17 2002  . magnesium hydroxide (MILK OF MAGNESIA) suspension 30 mL  30 mL Oral Daily PRN Elmarie Shiley A, NP      . nicotine polacrilex (NICORETTE) gum 2 mg  2 mg Oral PRN Cobos, Myer Peer, MD   2 mg at 09/15/17 1055  . thiamine (VITAMIN B-1) tablet 100 mg  100 mg Oral Daily Niel Hummer, NP       PTA Medications: Medications Prior to Admission  Medication Sig Dispense Refill Last Dose  . folic acid (FOLVITE) 1 MG tablet Take 1 tablet (1 mg total) by mouth daily.     Marland Kitchen gabapentin (NEURONTIN) 300 MG capsule Take 1 capsule (300 mg total) by mouth 2 (two) times daily.     . Multiple Vitamin (MULTIVITAMIN WITH MINERALS) TABS tablet Take 1 tablet by mouth daily.     Marland Kitchen thiamine 100 MG tablet Take 1 tablet (100 mg total) by mouth daily.       Musculoskeletal: Strength & Muscle Tone: within normal limits Gait & Station: normal Patient leans: N/A  Psychiatric Specialty Exam: Physical Exam  Constitutional: He is oriented to person, place, and time. He appears well-developed and well-nourished.  HENT:  Head: Normocephalic.  Neck: Normal range of motion.  Respiratory: Effort normal.  Musculoskeletal: Normal range of motion.  Neurological: He is alert and oriented to person, place, and time.  Psychiatric: He has a normal mood and affect. His speech is normal and behavior is normal. Judgment and thought content normal. Cognition and memory are normal.    Review of Systems  Psychiatric/Behavioral: Positive for substance abuse.  All other systems reviewed and are negative.   Blood pressure 135/84, pulse 88, temperature 99.5 F (37.5 C), temperature source Oral, resp. rate 18, height 6' (1.829 m), weight 105.7 kg (233 lb).Body mass index is 31.6 kg/m.  General Appearance: Casual  Eye Contact:  Good   Speech:  Normal Rate  Volume:  Normal  Mood:  Euthymic  Affect:  Congruent  Thought Process:  Coherent and Descriptions of Associations: Intact  Orientation:  Full (Time, Place, and Person)  Thought Content:  WDL and Logical  Suicidal Thoughts:  No  Homicidal Thoughts:  No  Memory:  Immediate;   Fair Recent;   Fair Remote;   Fair  Judgement:  Poor  Insight:  Lacking  Psychomotor Activity:  Normal  Concentration:  Concentration: Fair and Attention Span: Fair  Recall:  [employer] of Knowledge:  Fair  Language:  Good  Akathisia:  No  Handed:  Right  AIMS (if indicated):     Assets:  Housing Intimacy Leisure Time Physical Health Resilience Social Support  ADL's:  Intact  Cognition:  WNL  Sleep:  Number of Hours: 6.75    Treatment Plan Summary: Daily contact with patient to assess and evaluate symptoms and progress in treatment, Medication management and Plan :  Major depressive disorder, recurrent, severe, with no psychosis -Patient refuses any medications except detox medication, minimizes his depression, reports none  Alcohol dependence: -Gabapentin 200 mg BID for alcohol withdrawal in place  -Enroll in group and individual therapy -Discharge planing started  Observation Level/Precautions:  15 minute checks  Laboratory:  completed in ED, reviewed, stable  Psychotherapy:  Individual and group therapy  Medications:  See MAR  Consultations:  None  Discharge Concerns:  None  Estimated LOS:  Other:     Physician Treatment Plan for Primary Diagnosis: Major depressive disorder, recurrent episode, severe ([place])  - Long Term Goal(s): Improvement in symptoms so as ready for discharge  Short Term Goals: Ability to identify changes in lifestyle to reduce recurrence of condition will improve, Ability to verbalize feelings will improve, Ability to disclose and discuss suicidal ideas, Ability to demonstrate self-control will improve, Ability to identify and develop effective  coping behaviors will improve, Ability to maintain clinical measurements within normal limits will improve, Compliance with prescribed medications will improve and Ability to identify triggers associated with substance abuse/mental health issues will improve  Physician Treatment Plan for Secondary Diagnosis: Principal Problem:   Major depressive disorder, recurrent episode, severe ([place]) Active Problems:   Alcohol use disorder, severe, dependence ([place])  Long Term Goal(s): Improvement in symptoms so as ready for discharge  Short Term Goals: Ability to identify changes in lifestyle to reduce recurrence of condition will improve, Ability to verbalize feelings will improve, Ability to disclose and discuss suicidal ideas, Ability to demonstrate self-control will improve, Ability to identify and develop effective coping behaviors will improve, Ability to maintain clinical measurements within normal limits will improve, Compliance with prescribed medications will improve and Ability to identify triggers associated with substance abuse/mental health issues will improve  I certify that inpatient services furnished can reasonably be expected to improve the patient's condition.    Waylan Boga, NP 5/7/201911:49 AM

## 2017-09-15 NOTE — Progress Notes (Signed)
[facility] Group Notes:  (Nursing/MHT/Case Management/Adjunct)  Date:  09/15/2017  Time:  1515  Type of Therapy:  Psychoeducational Skills  Participation Level:  Active  Participation Quality:  Appropriate  Affect:  Appropriate  Cognitive:  Appropriate  Insight:  Appropriate  Engagement in Group:  Engaged  Modes of Intervention:  Socialization  Summary of Progress/Problems:Pt was alert and oriented during this group  Beatrix Shipper 09/15/2017, 4:24 PM

## 2017-09-15 NOTE — Progress Notes (Signed)
Pt reports that his day has been fine.  He denies having any withdrawal symptoms.  He denies SI/HI/AVH.  He voices no needs or concerns.  Pt was observed part of the evening standing in the door of the dayroom watching TV, but not going in.  He is pleasant and engages in conversation with Clinical research associate, but seems standoffish with his peers.  Support and encouragement offered.  Discharge plans are in process.  Safety maintained with q15 minute checks.

## 2017-09-15 NOTE — Progress Notes (Signed)
Adult Psychoeducational Group Note  Date:  09/15/2017 Time:  7:04 PM  Group Topic/Focus:  Self Care:   The focus of this group is to help patients understand the importance of self-care in order to improve or restore emotional, physical, spiritual, interpersonal, and financial health.  Participation Level:  Minimal  Participation Quality:  Appropriate  Affect:  Appropriate  Cognitive:  Appropriate  Insight: Limited  Engagement in Group:  Limited  Modes of Intervention:  Discussion  Additional Comments:  Pt attended group but did not give insight or feedback on anyone.   [name] [name] 09/15/2017, 7:04 PM

## 2017-09-15 NOTE — [facility] Suicide Risk Assessment (Signed)
[facility] Admission Suicide Risk Assessment   Nursing information obtained from:    Demographic factors:    Current Mental Status:    Loss Factors:    Historical Factors:    Risk Reduction Factors:     Total Time spent with patient: 45 minutes Principal Problem: Major depressive disorder, recurrent episode, severe ([facility]) Diagnosis:   Patient Active Problem List   Diagnosis Date Noted  . Intentional benzodiazepine overdose ([facility]) [T42.4X2A] 09/14/2017  . Suicidal ideation [R45.851] 09/14/2017  . Major depressive disorder, recurrent episode, severe ([facility]) [F33.2] 09/13/2017  . Alcohol use disorder, severe, dependence ([facility]) [F10.20] 09/13/2017  . Acute encephalopathy [G93.40] 09/12/2017   Subjective Data: Patient is seen and examined.  Patient is a 47 year old male who presented to the [facility] emergency department after an intentional overdose of Xanax and alcohol.  The patient stated that he and his fiance got into an argument and he had a larger amount of alcohol that he usually drinks.  He had some Xanax that was left over from his dead wife medicines.  These medicines have been present since 2017.  He stated he woke up and saw his family members around him.  He was admitted to the medical side of [facility] and in the intensive care unit.  Unfortunately he was unresponsive when originally examined, and required intubation.  The patient had said a text to his girlfriend and his ex-wife telling him that he was going to harm himself.  It was suspected that he ingested approximately 20 Xanax tablets.  His urine drug screen was positive for benzodiazepines but negative for acetaminophen and salicylate.  CT scan of the head as well as cervical x-rays were apparently negative.  He did have some agitation in the emergency department and required propofol.  The emergency room notes state that the patient had a history of bipolar disorder.  This information came from his ex-wife.  She also stated  that he had a history of cocaine, opiate dependence issues.  He denied having ever been diagnosed with bipolar disorder, and also denied any use of heroin.  He did admit to the cocaine.  He was transferred to our facility for evaluation and stabilization.  He denies any suicidal ideation currently.  Continued Clinical Symptoms:  Alcohol Use Disorder Identification Test Final Score (AUDIT): 12 The "Alcohol Use Disorders Identification Test", Guidelines for Use in Primary Care, Second Edition.  World Science writer [facility]). Score between 0-7:  no or low risk or alcohol related problems. Score between 8-15:  moderate risk of alcohol related problems. Score between 16-19:  high risk of alcohol related problems. Score 20 or above:  warrants further diagnostic evaluation for alcohol dependence and treatment.   CLINICAL FACTORS:   Alcohol/Substance Abuse/Dependencies   Musculoskeletal: Strength & Muscle Tone: within normal limits Gait & Station: normal Patient leans: N/A  Psychiatric Specialty Exam: Physical Exam  Constitutional: He is oriented to person, place, and time. He appears well-developed and well-nourished.  HENT:  Head: Normocephalic and atraumatic.  Respiratory: Effort normal.  Neurological: He is oriented to person, place, and time.    ROS  Blood pressure 137/87, pulse 85, temperature 99.5 F (37.5 C), temperature source Oral, resp. rate 18, height 6' (1.829 m), weight 105.7 kg (233 lb).Body mass index is 31.6 kg/m.  General Appearance: Disheveled  Eye Contact:  Fair  Speech:  Normal Rate  Volume:  Decreased  Mood:  Dysphoric  Affect:  Appropriate  Thought Process:  Coherent  Orientation:  Full (Time, Place, and Person)  Thought Content:  Logical  Suicidal Thoughts:  No  Homicidal Thoughts:  No  Memory:  Immediate;   Fair  Judgement:  Impaired  Insight:  Lacking  Psychomotor Activity:  Increased  Concentration:  Concentration: Fair  Recall:  [employer] of  Knowledge:  Fair  Language:  Good  Akathisia:  No  Handed:  Right  AIMS (if indicated):     Assets:  Desire for Improvement Housing Vocational/Educational  ADL's:  Intact  Cognition:  WNL  Sleep:  Number of Hours: 6.75      COGNITIVE FEATURES THAT CONTRIBUTE TO RISK:  None    SUICIDE RISK:   Mild:  Suicidal ideation of limited frequency, intensity, duration, and specificity.  There are no identifiable plans, no associated intent, mild dysphoria and related symptoms, good self-control (both objective and subjective assessment), few other risk factors, and identifiable protective factors, including available and accessible social support.  PLAN OF CARE: Patient is seen and examined.  Patient is a 47 year old male with the above-stated past psychiatric history who was admitted to the hospital for evaluation and stabilization after an intentional overdose.  He will be admitted to the hospital and placed on 15-minute checks.  He will be placed on a withdrawal protocol.  He will also be monitored for suicidal ideation.  He will meet with social work both individually and in groups.  We will work on Pharmacologist.  He stated he took the gabapentin this morning and it made him very unstable on his feet, so I am going to stop that.  We will continue the Pepcid, folic acid as well as thiamine.  We will get him to sign a release of information so we can corroborate some of his collateral information.  I certify that inpatient services furnished can reasonably be expected to improve the patient's condition.   Antonieta Pert, MD 09/15/2017, 1:20 PM

## 2017-09-15 NOTE — Progress Notes (Signed)
Recreation Therapy Notes  Animal-Assisted Activity (AAA) Program Checklist/Progress Notes Patient Eligibility Criteria Checklist & Daily Group note for Rec Tx Intervention  Date: 5.7.19 Time: 1445 Location: [address]   AAA/T Program Assumption of Risk Form signed by Engineer, production or Parent Legal Guardian YES   Patient is free of allergies or sever asthma YES   Patient reports no fear of animals YES   Patient reports no history of cruelty to animals YES   Patient understands his/her participation is voluntary YES   Patient washes hands before animal contact YES   Patient washes hands after animal contact YES   Behavioral Response: Engaged  Education: Charity fundraiser, Appropriate Animal Interaction   Education Outcome: Acknowledges understanding/In group clarification offered/Needs additional education.   Clinical Observations/Feedback: Pt did not attend group.    Caroll Rancher, LRT/CTRS         Caroll Rancher A 09/15/2017 4:03 PM

## 2017-09-15 NOTE — Plan of Care (Signed)
No self injurious behavior observed or expressed. 

## 2017-09-15 NOTE — Progress Notes (Signed)
D: Patient in bed on approach. Give patient a new order of gabapentin and pepcid. He reports not being on the before but records indicated that he was taking at the hospital prior to admission here. Undersigned initially thought gabapentin may be for mood but seen in a physician's note that it was to help alcohol withdrawals. Vitals are stable with no withdrawals or report of any distress. Previous shift reported that he may want to fill out a 72 hour request for discharge but he did not mention it when I talked with him. He went back to bed after taking the medication. A: Staff will monitor on q 15 minute checks, follow treatment plan, and give meds as ordered. R: Cooperative on the unit.

## 2017-09-16 DIAGNOSIS — F419 Anxiety disorder, unspecified: Secondary | ICD-10-CM

## 2017-09-16 DIAGNOSIS — R450 Nervousness: Secondary | ICD-10-CM

## 2017-09-16 NOTE — Progress Notes (Signed)
Pt states he had a "great" day and is looking forward to being discharged tomorrow.  He tells Clinical research associate that he spoke to the doctor and that he will be discharged in the morning.  He says that he is going to attend AA meetings with a friend after he is discharged.  He denies SI/HI/AVH.  He voices no needs or concerns.  He declined to take a sleep aid tonight.  Support and encouragement offered.  Discharge plans are in process.  Safety maintained with q15 minute checks.

## 2017-09-16 NOTE — Progress Notes (Signed)
Pt continues to refuse all medications saying that he is ok and does not need any medications. Pt denies si and hi. MHT reported to Clinical research associate that pt's ex wife called and said that pt has called and threatened her life and told her to tell the hospital that he was ok. Pt's ex wife does not have pt's code number and staff could not give any information on the phone call. Writer alerted SW and NP. Safety maintained on the unit.

## 2017-09-16 NOTE — [facility] Suicide Risk Assessment (Signed)
[facility] INPATIENT:  Family/Significant Other Suicide Prevention Education  Suicide Prevention Education:  Education Completed; Billy Lawrence, girlfriend [phone]) has been identified by the patient as the family member/significant other with whom the patient will be residing, and identified as the person(s) who will aid the patient in the event of a mental health crisis (suicidal ideations/suicide attempt).  With written consent from the patient, the family member/significant other has been provided the following suicide prevention education, prior to the and/or following the discharge of the patient.  The suicide prevention education provided includes the following:  Suicide risk factors  Suicide prevention and interventions  National Suicide Hotline telephone number  [facility] assessment telephone number  [facility] Emergency Assistance 911  [facility] and/or Residential Mobile Crisis Unit telephone number  Request made of family/significant other to:  Remove weapons (e.g., guns, rifles, knives), all items previously/currently identified as safety concern.    Remove drugs/medications (over-the-counter, prescriptions, illicit drugs), all items previously/currently identified as a safety concern.  The family member/significant other verbalizes understanding of the suicide prevention education information provided.  The family member/significant other agrees to remove the items of safety concern listed above.  Billy Lawrence 09/16/2017, 12:50 PM

## 2017-09-16 NOTE — [facility] Group Notes (Signed)
LCSW Group Therapy Note 09/16/2017 1:01 PM  Type of Therapy/Topic: Group Therapy: Emotion Regulation  Participation Level: Active   Description of Group:  The purpose of this group is to assist patients in learning to regulate negative emotions and experience positive emotions. Patients will be guided to discuss ways in which they have been vulnerable to their negative emotions. These vulnerabilities will be juxtaposed with experiences of positive emotions or situations, and patients will be challenged to use positive emotions to combat negative ones. Special emphasis will be placed on coping with negative emotions in conflict situations, and patients will process healthy conflict resolution skills.  Therapeutic Goals: 1. Patient will identify two positive emotions or experiences to reflect on in order to balance out negative emotions 2. Patient will label two or more emotions that they find the most difficult to experience 3. Patient will demonstrate positive conflict resolution skills through discussion and/or role plays  Summary of Patient Progress:  Doak was engaged and participated throughout the group session. Endre reports that he struggles with "self control". Maxamilian reports that he would like to learn how to walk away and think about the outcomes of his decisions, before he acts on them.    Therapeutic Modalities:  Cognitive Behavioral Therapy Feelings Identification Dialectical Behavioral Therapy   Alcario Drought Clinical Social Worker

## 2017-09-16 NOTE — Progress Notes (Signed)
Pt attended goals group today. Pt did not have a goal today.

## 2017-09-16 NOTE — Progress Notes (Addendum)
[facility] MD Progress Note  09/16/2017 1:32 PM Arlie Riker  MRN:  [id number] Subjective:  "I want to go home, I'm fine, I'm normal."  Patient is focused on discharge and has been calling his ex-wife and threatening her to call here to notify how well he is doing so he can leave.  No insight into how serious his actions were, overdose on Xanax and alcohol.  Explained to him that he almost died but did not make the connection.  Denies depression, withdrawal symptoms, and suicidal ideations.  Sleep was "well", appetite is "fine".  He does plan to go with his "buddy" who has been clean for 6 years to his AA meeting after discharge.   Principal Problem: Major depressive disorder, recurrent episode, severe (HCC) Diagnosis:   Patient Active Problem List   Diagnosis Date Noted  . Major depressive disorder, recurrent episode, severe (HCC) [F33.2] 09/13/2017    Priority: High  . Alcohol use disorder, severe, dependence (HCC) [F10.20] 09/13/2017    Priority: High  . Intentional benzodiazepine overdose (HCC) [T42.4X2A] 09/14/2017  . Suicidal ideation [R45.851] 09/14/2017  . Acute encephalopathy [G93.40] 09/12/2017   Total Time spent with patient: 30 minutes  Past Psychiatric History: depression, substance abuse  Past Medical History: History reviewed. No pertinent past medical history. History reviewed. No pertinent surgical history. Family History: History reviewed. No pertinent family history. Family Psychiatric  History: none Social History:  Social History   Substance and Sexual Activity  Alcohol Use Yes     Social History   Substance and Sexual Activity  Drug Use Yes    Social History   Socioeconomic History  . Marital status: Single    Spouse name: Not on file  . Number of children: Not on file  . Years of education: Not on file  . Highest education level: Not on file  Occupational History  . Not on file  Social Needs  . Financial resource strain: Not on file  . Food insecurity:     Worry: Not on file    Inability: Not on file  . Transportation needs:    Medical: Not on file    Non-medical: Not on file  Tobacco Use  . Smoking status: Current Every Day Smoker  . Smokeless tobacco: Never Used  Substance and Sexual Activity  . Alcohol use: Yes  . Drug use: Yes  . Sexual activity: Yes  Lifestyle  . Physical activity:    Days per week: Not on file    Minutes per session: Not on file  . Stress: Not on file  Relationships  . Social connections:    Talks on phone: Not on file    Gets together: Not on file    Attends religious service: Not on file    Active member of club or organization: Not on file    Attends meetings of clubs or organizations: Not on file    Relationship status: Not on file  Other Topics Concern  . Not on file  Social History Narrative  . Not on file   Additional Social History:    Sleep: Good  Appetite:  Good  Current Medications: Current Facility-Administered Medications  Medication Dose Route Frequency Provider Last Rate Last Dose  . famotidine (PEPCID) tablet 20 mg  20 mg Oral BID Fransisca Kaufmann A, NP   20 mg at 09/14/17 2002  . folic acid (FOLVITE) tablet 1 mg  1 mg Per Tube Daily Thermon Leyland, NP      . gabapentin (NEURONTIN)  capsule 300 mg  300 mg Oral BID PRN Antonieta Pert, MD      . hydrOXYzine (ATARAX/VISTARIL) tablet 25 mg  25 mg Oral Q6H PRN Antonieta Pert, MD      . loperamide (IMODIUM) capsule 2-4 mg  2-4 mg Oral PRN Antonieta Pert, MD      . LORazepam (ATIVAN) tablet 1 mg  1 mg Oral Q6H PRN Antonieta Pert, MD      . LORazepam (ATIVAN) tablet 1 mg  1 mg Oral TID Antonieta Pert, MD       Followed by  . [START ON 09/17/2017] LORazepam (ATIVAN) tablet 1 mg  1 mg Oral BID Antonieta Pert, MD       Followed by  . [START ON 09/18/2017] LORazepam (ATIVAN) tablet 1 mg  1 mg Oral Daily Antonieta Pert, MD      . magnesium hydroxide (MILK OF MAGNESIA) suspension 30 mL  30 mL Oral Daily PRN Thermon Leyland,  NP      . multivitamin with minerals tablet 1 tablet  1 tablet Oral Daily Antonieta Pert, MD      . nicotine polacrilex (NICORETTE) gum 2 mg  2 mg Oral PRN Cobos, Rockey Situ, MD   2 mg at 09/16/17 1307  . ondansetron (ZOFRAN-ODT) disintegrating tablet 4 mg  4 mg Oral Q6H PRN Antonieta Pert, MD      . thiamine (B-1) injection 100 mg  100 mg Intramuscular Once Antonieta Pert, MD      . thiamine (VITAMIN B-1) tablet 100 mg  100 mg Oral Daily Fransisca Kaufmann A, NP      . thiamine (VITAMIN B-1) tablet 100 mg  100 mg Oral Daily Antonieta Pert, MD        Lab Results:  Results for orders placed or performed during the hospital encounter of 09/12/17 (from the past 48 hour(s))  Glucose, capillary     Status: Abnormal   Collection Time: 09/14/17  3:01 PM  Result Value Ref Range   Glucose-Capillary 114 (H) 65 - 99 mg/dL    Blood Alcohol level:  Lab Results  Component Value Date   ETH 208 (H) 09/12/2017    Metabolic Disorder Labs: No results found for: HGBA1C, MPG No results found for: PROLACTIN Lab Results  Component Value Date   TRIG 178 (H) 09/12/2017    Physical Findings: AIMS: Facial and Oral Movements Muscles of Facial Expression: None, normal Lips and Perioral Area: None, normal Jaw: None, normal Tongue: None, normal,Extremity Movements Upper (arms, wrists, hands, fingers): None, normal Lower (legs, knees, ankles, toes): None, normal, Trunk Movements Neck, shoulders, hips: None, normal, Overall Severity Severity of abnormal movements (highest score from questions above): None, normal Incapacitation due to abnormal movements: None, normal Patient's awareness of abnormal movements (rate only patient's report): No Awareness, Dental Status Current problems with teeth and/or dentures?: No Does patient usually wear dentures?: No  CIWA:  CIWA-Ar Total: 0 COWS:     Musculoskeletal: Strength & Muscle Tone: within normal limits Gait & Station: normal Patient leans:  N/A  Psychiatric Specialty Exam: Physical Exam  Constitutional: He is oriented to person, place, and time. He appears well-developed and well-nourished.  HENT:  Head: Normocephalic.  Neck: Normal range of motion.  Musculoskeletal: Normal range of motion.  Neurological: He is alert and oriented to person, place, and time.  Psychiatric: His speech is normal and behavior is normal. Judgment and thought content normal. His mood appears  anxious. Cognition and memory are normal. He exhibits a depressed mood.    Review of Systems  Psychiatric/Behavioral: Positive for depression and suicidal ideas. The patient is nervous/anxious.   All other systems reviewed and are negative.   Blood pressure (!) 147/91, pulse 79, temperature 98.7 F (37.1 C), temperature source Oral, resp. rate 18, height 6' (1.829 m), weight 105.7 kg (233 lb).Body mass index is 31.6 kg/m.  General Appearance: Casual  Eye Contact:  Good  Speech:  Normal Rate  Volume:  Normal  Mood:  Anxious and Depressed  Affect:  Blunt  Thought Process:  Coherent and Descriptions of Associations: Intact  Orientation:  Full (Time, Place, and Person)  Thought Content:  Rumination  Suicidal Thoughts:  No  Homicidal Thoughts:  No  Memory:  Immediate;   Fair Recent;   Fair Remote;   Fair  Judgement:  Fair  Insight:  Lacking  Psychomotor Activity:  Normal  Concentration:  Concentration: Fair and Attention Span: Fair  Recall:  [employer] of Knowledge:  Fair  Language:  Good  Akathisia:  No  Handed:  Right  AIMS (if indicated):     Assets:  Housing Leisure Time Physical Health Resilience Social Support Vocational/Educational  ADL's:  Intact  Cognition:  WNL  Sleep:  Number of Hours: 6.75     Treatment Plan Summary: Daily contact with patient to assess and evaluate symptoms and progress in treatment, Medication management and Plan :  Major depressive disorder, recurrent, severe without psychosis: -Refuses  medications  Alcohol dependence: -Continue Ativan alcohol detox protocol in place -Continue Gabapetin 300 mg BID PRN for withdrawal symptoms  Anxiety -Continue Gabapetin 300 mg BID PRN for withdrawal symptoms  -Continue group and individual therapy -Continue discharge planning  LORD, Catha Nottingham, NP 09/16/2017, 1:32 PM   Addendum  Patient was seen and examined.  Social work contacted collateral information.  Both ex-wife and girlfriend have discussed the fact that he is minimizing his alcohol issues, and has had periods of time where irritability is significant.  I have discussed this with the patient today.  He is not showing any signs or symptoms of withdrawal symptoms.  He does request discharge, and really denies wanting any kind of medication at this point.  I discussed with him openly my concern about his alcohol intake.  He stated has already contacted his friend, and he attends AA, and he is planning on getting involved with that as soon as he is discharged.  We also discussed outpatient substance abuse treatment as well as therapy.  The plan will be for him to be discharged tomorrow morning.

## 2017-09-16 NOTE — Tx Team (Signed)
Interdisciplinary Treatment and Diagnostic Plan Update  09/16/2017 Time of Session: 0830AM Billy Lawrence MRN: [id number]  Principal Diagnosis: Major depressive disorder, recurrent episode, severe (HCC)  Secondary Diagnoses: Principal Problem:   Major depressive disorder, recurrent episode, severe (HCC) Active Problems:   Alcohol use disorder, severe, dependence (HCC)   Current Medications:  Current Facility-Administered Medications  Medication Dose Route Frequency Provider Last Rate Last Dose  . famotidine (PEPCID) tablet 20 mg  20 mg Oral BID Fransisca Kaufmann A, NP   20 mg at 09/14/17 2002  . folic acid (FOLVITE) tablet 1 mg  1 mg Per Tube Daily Fransisca Kaufmann A, NP      . gabapentin (NEURONTIN) capsule 300 mg  300 mg Oral BID PRN Antonieta Pert, MD      . hydrOXYzine (ATARAX/VISTARIL) tablet 25 mg  25 mg Oral Q6H PRN Antonieta Pert, MD      . loperamide (IMODIUM) capsule 2-4 mg  2-4 mg Oral PRN Antonieta Pert, MD      . LORazepam (ATIVAN) tablet 1 mg  1 mg Oral Q6H PRN Antonieta Pert, MD      . LORazepam (ATIVAN) tablet 1 mg  1 mg Oral TID Antonieta Pert, MD       Followed by  . [START ON 09/17/2017] LORazepam (ATIVAN) tablet 1 mg  1 mg Oral BID Antonieta Pert, MD       Followed by  . [START ON 09/18/2017] LORazepam (ATIVAN) tablet 1 mg  1 mg Oral Daily Antonieta Pert, MD      . magnesium hydroxide (MILK OF MAGNESIA) suspension 30 mL  30 mL Oral Daily PRN Thermon Leyland, NP      . multivitamin with minerals tablet 1 tablet  1 tablet Oral Daily Antonieta Pert, MD      . nicotine polacrilex (NICORETTE) gum 2 mg  2 mg Oral PRN Cobos, Rockey Situ, MD   2 mg at 09/15/17 1507  . ondansetron (ZOFRAN-ODT) disintegrating tablet 4 mg  4 mg Oral Q6H PRN Antonieta Pert, MD      . thiamine (B-1) injection 100 mg  100 mg Intramuscular Once Antonieta Pert, MD      . thiamine (VITAMIN B-1) tablet 100 mg  100 mg Oral Daily Fransisca Kaufmann A, NP      . thiamine (VITAMIN B-1)  tablet 100 mg  100 mg Oral Daily Antonieta Pert, MD       PTA Medications: Medications Prior to Admission  Medication Sig Dispense Refill Last Dose  . folic acid (FOLVITE) 1 MG tablet Take 1 tablet (1 mg total) by mouth daily.     Marland Kitchen gabapentin (NEURONTIN) 300 MG capsule Take 1 capsule (300 mg total) by mouth 2 (two) times daily.     . Multiple Vitamin (MULTIVITAMIN WITH MINERALS) TABS tablet Take 1 tablet by mouth daily.     Marland Kitchen thiamine 100 MG tablet Take 1 tablet (100 mg total) by mouth daily.       Patient Stressors: Marital or family conflict Substance abuse  Patient Strengths: Ability for insight Average or above average intelligence Capable of independent living [employer] of knowledge Motivation for treatment/growth  Treatment Modalities: Medication Management, Group therapy, Case management,  1 to 1 session with clinician, Psychoeducation, Recreational therapy.   Physician Treatment Plan for Primary Diagnosis: Major depressive disorder, recurrent episode, severe (HCC) Long Term Goal(s): Improvement in symptoms so as ready for discharge Improvement in symptoms so as  ready for discharge   Short Term Goals: Ability to identify changes in lifestyle to reduce recurrence of condition will improve Ability to verbalize feelings will improve Ability to disclose and discuss suicidal ideas Ability to demonstrate self-control will improve Ability to identify and develop effective coping behaviors will improve Ability to maintain clinical measurements within normal limits will improve Compliance with prescribed medications will improve Ability to identify triggers associated with substance abuse/mental health issues will improve Ability to identify changes in lifestyle to reduce recurrence of condition will improve Ability to verbalize feelings will improve Ability to disclose and discuss suicidal ideas Ability to demonstrate self-control will improve Ability to identify and  develop effective coping behaviors will improve Ability to maintain clinical measurements within normal limits will improve Compliance with prescribed medications will improve Ability to identify triggers associated with substance abuse/mental health issues will improve  Medication Management: Evaluate patient's response, side effects, and tolerance of medication regimen.  Therapeutic Interventions: 1 to 1 sessions, Unit Group sessions and Medication administration.  Evaluation of Outcomes: Progressing  Physician Treatment Plan for Secondary Diagnosis: Principal Problem:   Major depressive disorder, recurrent episode, severe (HCC) Active Problems:   Alcohol use disorder, severe, dependence (HCC)  Long Term Goal(s): Improvement in symptoms so as ready for discharge Improvement in symptoms so as ready for discharge   Short Term Goals: Ability to identify changes in lifestyle to reduce recurrence of condition will improve Ability to verbalize feelings will improve Ability to disclose and discuss suicidal ideas Ability to demonstrate self-control will improve Ability to identify and develop effective coping behaviors will improve Ability to maintain clinical measurements within normal limits will improve Compliance with prescribed medications will improve Ability to identify triggers associated with substance abuse/mental health issues will improve Ability to identify changes in lifestyle to reduce recurrence of condition will improve Ability to verbalize feelings will improve Ability to disclose and discuss suicidal ideas Ability to demonstrate self-control will improve Ability to identify and develop effective coping behaviors will improve Ability to maintain clinical measurements within normal limits will improve Compliance with prescribed medications will improve Ability to identify triggers associated with substance abuse/mental health issues will improve     Medication  Management: Evaluate patient's response, side effects, and tolerance of medication regimen.  Therapeutic Interventions: 1 to 1 sessions, Unit Group sessions and Medication administration.  Evaluation of Outcomes: Progressing   RN Treatment Plan for Primary Diagnosis: Major depressive disorder, recurrent episode, severe (HCC) Long Term Goal(s): Knowledge of disease and therapeutic regimen to maintain health will improve  Short Term Goals: Ability to remain free from injury will improve, Ability to demonstrate self-control, Ability to participate in decision making will improve and Ability to disclose and discuss suicidal ideas  Medication Management: RN will administer medications as ordered by provider, will assess and evaluate patient's response and provide education to patient for prescribed medication. RN will report any adverse and/or side effects to prescribing provider.  Therapeutic Interventions: 1 on 1 counseling sessions, Psychoeducation, Medication administration, Evaluate responses to treatment, Monitor vital signs and CBGs as ordered, Perform/monitor CIWA, COWS, AIMS and Fall Risk screenings as ordered, Perform wound care treatments as ordered.  Evaluation of Outcomes: Progressing   LCSW Treatment Plan for Primary Diagnosis: Major depressive disorder, recurrent episode, severe (HCC) Long Term Goal(s): Safe transition to appropriate next level of care at discharge, Engage patient in therapeutic group addressing interpersonal concerns.  Short Term Goals: Engage patient in aftercare planning with referrals and resources, Facilitate patient progression  through stages of change regarding substance use diagnoses and concerns and Identify triggers associated with mental health/substance abuse issues  Therapeutic Interventions: Assess for all discharge needs, 1 to 1 time with Social worker, Explore available resources and support systems, Assess for adequacy in community support network,  Educate family and significant other(s) on suicide prevention, Complete Psychosocial Assessment, Interpersonal group therapy.  Evaluation of Outcomes: Progressing   Progress in Treatment: Attending groups: Yes. Participating in groups: Yes. Taking medication as prescribed: Yes. Toleration medication: Yes. Family/Significant other contact made: No, will contact:  family member if patient consents to collateral contact. Pt gave permission to contact his girlfriend Patient understands diagnosis: Yes. Discussing patient identified problems/goals with staff: Yes. Medical problems stabilized or resolved: Yes. Denies suicidal/homicidal ideation: Yes. Issues/concerns per patient self-inventory: No. Other: n/a   New problem(s) identified: No, Describe:  n/a  New Short Term/Long Term Goal(s): detox, medication management for mood stabilization; elimination of SI thoughts; development of comprehensive mental wellness/sobriety plan.   Patient Goal: "To get help for my substance abuse and suicidal thoughts."   Discharge Plan or Barriers: CSW assessing for appropriate referrals--pt is refusing all referrals at this time. [facility] pamphlet and Mobile Crisis; AA/NA information provided for additional community support.   Reason for Continuation of Hospitalization: Anxiety Depression Medication stabilization Suicidal ideation Withdrawal symptoms  Estimated Length of Stay: Friday, 09/18/17  Attendees: Patient: Billy Lawrence  09/16/2017 9:13 AM  Physician: Dr. Jola Babinski MD; Dr. Altamese [place] MD 09/16/2017 9:13 AM  Nursing: Vanessa Kick RN; Clydie Braun RN 09/16/2017 9:13 AM  RN Care Manager:x 09/16/2017 9:13 AM  Social Worker: Trula Slade, LCSW; Baldo Daub LCSWA 09/16/2017 9:13 AM  Recreational Therapist: x 09/16/2017 9:13 AM  Other: Armandina Stammer NP; Nanine Means NP 09/16/2017 9:13 AM  Other:  09/16/2017 9:13 AM  Other: 09/16/2017 9:13 AM    Scribe for Treatment Team: Ledell Peoples Smart, LCSW 09/16/2017 9:13 AM

## 2017-09-16 NOTE — Progress Notes (Signed)
Recreation Therapy Notes  Date: 5.8.19 Time: 0930 Location: [address] Dayroom  Group Topic: Stress Management  Goal Area(s) Addresses:  Patient will verbalize importance of using healthy stress management.  Patient will identify positive emotions associated with healthy stress management.   Behavioral Response: Engaged  Intervention: Stress Management  Activity :  Meditation.  LRT introduced the stress management technique of meditation.  LRT played a meditation that focused on the strength and resilience of mountains and how those characteristics can be used in daily life.  Patients were to follow along as meditation played.  Education:  Stress Management, Discharge Planning.   Education Outcome: Acknowledges edcuation/In group clarification offered/Needs additional education  Clinical Observations/Feedback: Pt attended and participated in group.      Billy Lawrence Billy Lawrence, Billy Lawrence         Billy Lawrence A 09/16/2017 11:39 AM 

## 2017-09-17 MED ORDER — GABAPENTIN 300 MG PO CAPS: 300.0000 mg | ORAL_CAPSULE | Freq: Two times a day (BID) | ORAL | 0 refills | Status: DC | PRN

## 2017-09-17 MED ORDER — HYDROXYZINE HCL 25 MG PO TABS: 25.0000 mg | ORAL_TABLET | Freq: Four times a day (QID) | ORAL | 0 refills | Status: DC | PRN

## 2017-09-17 NOTE — [facility] Group Notes (Signed)
Adult Psychoeducational Group Note  Date:  09/17/2017 Time:  9:43 AM  Group Topic/Focus:  Goals Group:   The focus of this group is to help patients establish daily goals to achieve during treatment and discuss how the patient can incorporate goal setting into their daily lives to aide in recovery.  Participation Level:  Active  Participation Quality:  Appropriate  Affect:  Angry  Cognitive:  Alert  Insight: Appropriate  Engagement in Group:  Engaged  Modes of Intervention:  Orientation  Additional Comments:  Pt attend and participated in orientation/goals group. Pt goal for today is to get back to the world  Dellia Nims 09/17/2017, 9:43 AM

## 2017-09-17 NOTE — Progress Notes (Signed)
Patient ID: Billy Lawrence, male   DOB: 03/03/1971, 47 y.o.   MRN: [id number]  Nursing Progress Note [id number]  Data: Patient presents with pleasant mood and is cooperative this morning. Patient denied need for medications. Patient denies SI/HI/AVH or pain. Patient contracts for safety on the unit at this time. Patient completed self-inventory sheet and rates depression, hopelessness, and anxiety 0,0,0 respectively. Patient rates their sleep and appetite as good/good respectively. Patient states goal for today is to "go to [employer] Patient reports he is discharging today.  Action: Patient educated about and provided medication per provider's orders. Patient safety maintained with q15 min safety checks. Low fall risk precautions in place. Emotional support given. 1:1 interaction and active listening provided. Patient encouraged to attend meals and groups. Labs, vital signs and patient behavior monitored throughout shift. Patient encouraged to work on treatment plan and goals.  Response: Patient remains safe on the unit at this time. Patient is interacting with peers appropriately on the unit. Will continue to support and monitor.

## 2017-09-17 NOTE — Discharge Summary (Signed)
Physician Discharge Summary Note  Patient:  Billy Lawrence is an 47 y.o., male MRN:  [id number] DOB:  07-Apr-1971 Patient phone:  [phone] (home)  Patient address:   [address] [place] [address],  Total Time spent with patient: 20 minutes  Date of Admission:  09/14/2017 Date of Discharge: 09/17/17  Reason for Admission:  Worsening depression with intentional drug overdose  Principal Problem: Major depressive disorder, recurrent episode, severe [facility]) Discharge Diagnoses: Patient Active Problem List   Diagnosis Date Noted  . Intentional benzodiazepine overdose (HCC) [T42.4X2A] 09/14/2017  . Suicidal ideation [R45.851] 09/14/2017  . Major depressive disorder, recurrent episode, severe (HCC) [F33.2] 09/13/2017  . Alcohol use disorder, severe, dependence (HCC) [F10.20] 09/13/2017  . Acute encephalopathy [G93.40] 09/12/2017    Past Psychiatric History: depression, substance abuse  Past Medical History: History reviewed. No pertinent past medical history. History reviewed. No pertinent surgical history. Family History: History reviewed. No pertinent family history. Family Psychiatric  History: None Social History:  Social History   Substance and Sexual Activity  Alcohol Use Yes     Social History   Substance and Sexual Activity  Drug Use Yes    Social History   Socioeconomic History  . Marital status: Single    Spouse name: Not on file  . Number of children: Not on file  . Years of education: Not on file  . Highest education level: Not on file  Occupational History  . Not on file  Social Needs  . Financial resource strain: Not on file  . Food insecurity:    Worry: Not on file    Inability: Not on file  . Transportation needs:    Medical: Not on file    Non-medical: Not on file  Tobacco Use  . Smoking status: Current Every Day Smoker  . Smokeless tobacco: Never Used  Substance and Sexual Activity  . Alcohol use: Yes  . Drug use: Yes  . Sexual  activity: Yes  Lifestyle  . Physical activity:    Days per week: Not on file    Minutes per session: Not on file  . Stress: Not on file  Relationships  . Social connections:    Talks on phone: Not on file    Gets together: Not on file    Attends religious service: Not on file    Active member of club or organization: Not on file    Attends meetings of clubs or organizations: Not on file    Relationship status: Not on file  Other Topics Concern  . Not on file  Social History Narrative  . Not on file    Hospital Course:   09/15/17 [facility] MD Assessment: Prior to admission:  patient and agree with plan as detailed by APP note. Briefly, this is a 62yoM with hx Etoh abuse and Illicit drug use (cocaine, heroin, and marijuana per his ex-wife), presents to the ER as an intentional drug overdose. Reportedly patient sent a text to his minor daughter stating that he wanted to die. She showed the text to her mother (patient's ex-wife) who then called the patient. Patient admitted to her at that time that he had taken xanax and that he wanted to die so she couldn't call an ambulance. When EMS arrived patient was minimally responsive and required intubation for airway protection. Reportedly patient has had a lot of life stressors recently, with his 2nd wife dying then more recently a girlfriend leaving him. Present in ER are patient's ex-wife and 2 minor  children. He also has a 67 year old daughter who, per the ex-wife, has said she wants nothing to do with the patient. In that case patient's parents (who reportedly are both alive) would be the next of kin; patient is also said to have a brother. On exam patient is deeply sedated, no response to sternal rub. PERRL, Small abrasion on forehead. Lungs CTA b/l, Abd soft NTND. Patient's ex-wife thinks he took 20 tabs of  Xanax. This fits with his UDS that is positive only for benzos. Etoh positive at 208. CXR on my review shows ETT at the [facility]; not clear if it  was retracted already in Er. Will repeat CXR now. Obtain ABG and adjust vent as needed. EKG shows sinus tach @ 115 with Qtc 473 and QRS 104. Will need 1:1 sitter and psych consult following extubation.  Today, patient minimizes his actions and denies any depression along with suicidal ideations.  Denies withdrawal symptoms and adamant he wants no medications except his alcohol detox medications.  His finance, Nicki Guadalajara, returned to him after living him a couple of months ago.  When she left, he went to the liquor store and started his drinking downward spiral.  He had been clear for 7 months prior to this and many years in the past.  Over confidant in his ability to refrain from alcohol use but does have a good support system with his girlfriend and children.  Sleep and appetite are fair.  Pleasant and cooperative, engages easily in conversaion  Patient remained on the [facility] unit for 2 days. The patient stabilized on medication and therapy. Patient was discharged on Neurontin 300 mg BID PRN and Vistaril 25 mg Q6H PRN. Patient partially completed alcohol detox while here. Patient has shown improvement with improved mood, affect, sleep, appetite, and interaction. Patient has attended group and participated. Patient has been seen in the day room interacting with peers and staff appropriately. Patient denies any SI/HI/AVH and contracts for safety. Patient refused follow up appointment. Patient is provided with prescriptions for their medications upon discharge.    Physical Findings: AIMS: Facial and Oral Movements Muscles of Facial Expression: None, normal Lips and Perioral Area: None, normal Jaw: None, normal Tongue: None, normal,Extremity Movements Upper (arms, wrists, hands, fingers): None, normal Lower (legs, knees, ankles, toes): None, normal, Trunk Movements Neck, shoulders, hips: None, normal, Overall Severity Severity of abnormal movements (highest score from questions above): None,  normal Incapacitation due to abnormal movements: None, normal Patient's awareness of abnormal movements (rate only patient's report): No Awareness, Dental Status Current problems with teeth and/or dentures?: No Does patient usually wear dentures?: No  CIWA:  CIWA-Ar Total: 0 COWS:     Musculoskeletal: Strength & Muscle Tone: within normal limits Gait & Station: normal Patient leans: N/A  Psychiatric Specialty Exam: Physical Exam  Nursing note and vitals reviewed. Constitutional: He is oriented to person, place, and time. He appears well-developed and well-nourished.  Cardiovascular: Normal rate.  Respiratory: Effort normal.  Musculoskeletal: Normal range of motion.  Neurological: He is alert and oriented to person, place, and time.  Skin: Skin is warm.    Review of Systems  Constitutional: Negative.   HENT: Negative.   Eyes: Negative.   Respiratory: Negative.   Cardiovascular: Negative.   Gastrointestinal: Negative.   Genitourinary: Negative.   Musculoskeletal: Negative.   Skin: Negative.   Neurological: Negative.   Endo/Heme/Allergies: Negative.   Psychiatric/Behavioral: Negative.     Blood pressure 123/88, pulse 75, temperature 98.1 F (36.7 C),  temperature source Oral, resp. rate 20, height 6' (1.829 m), weight 105.7 kg (233 lb).Body mass index is 31.6 kg/m.  General Appearance: Casual  Eye Contact:  Good  Speech:  Clear and Coherent and Normal Rate  Volume:  Normal  Mood:  Euthymic  Affect:  Congruent  Thought Process:  Goal Directed and Descriptions of Associations: Intact  Orientation:  Full (Time, Place, and Person)  Thought Content:  WDL  Suicidal Thoughts:  No  Homicidal Thoughts:  No  Memory:  Immediate;   Good Recent;   Good Remote;   Good  Judgement:  Fair  Insight:  Good  Psychomotor Activity:  Normal  Concentration:  Concentration: Good and Attention Span: Good  Recall:  Good  Fund of Knowledge:  Good  Language:  Good  Akathisia:  No   Handed:  Right  AIMS (if indicated):     Assets:  Communication Skills Desire for Improvement Financial Resources/Insurance Housing Physical Health Social Support Transportation  ADL's:  Intact  Cognition:  WNL  Sleep:  Number of Hours: 5     Have you used any form of tobacco in the last 30 days? (Cigarettes, Smokeless Tobacco, Cigars, and/or Pipes): Yes  Has this patient used any form of tobacco in the last 30 days? (Cigarettes, Smokeless Tobacco, Cigars, and/or Pipes) Yes, Yes, A prescription for an FDA-approved tobacco cessation medication was offered at discharge and the patient refused  Blood Alcohol level:  Lab Results  Component Value Date   ETH 208 (H) 09/12/2017    Metabolic Disorder Labs:  No results found for: HGBA1C, MPG No results found for: PROLACTIN Lab Results  Component Value Date   TRIG 178 (H) 09/12/2017    See Psychiatric Specialty Exam and Suicide Risk Assessment completed by Attending Physician prior to discharge.  Discharge destination:  Home  Is patient on multiple antipsychotic therapies at discharge:  No   Has Patient had three or more failed trials of antipsychotic monotherapy by history:  No  Recommended Plan for Multiple Antipsychotic Therapies: NA   Allergies as of 09/17/2017   No Known Allergies     Medication List    TAKE these medications     Indication  folic acid 1 MG tablet Commonly known as:  FOLVITE Take 1 tablet (1 mg total) by mouth daily.  Indication:  Per PCP   gabapentin 300 MG capsule Commonly known as:  NEURONTIN Take 1 capsule (300 mg total) by mouth 2 (two) times daily as needed (anxiety). What changed:    when to take this  reasons to take this  Indication:  Alcohol Withdrawal Syndrome, anxiety   hydrOXYzine 25 MG tablet Commonly known as:  ATARAX/VISTARIL Take 1 tablet (25 mg total) by mouth every 6 (six) hours as needed for anxiety.  Indication:  Feeling Anxious   multivitamin with minerals Tabs  tablet Take 1 tablet by mouth daily.  Indication:  Per PCP   thiamine 100 MG tablet Take 1 tablet (100 mg total) by mouth daily.  Indication:  Per PCP        Follow-up recommendations:  Continue activity as tolerated. Continue diet as recommended by your PCP. Ensure to keep all appointments with outpatient providers.  Comments:  Patient is instructed prior to discharge to: Take all medications as prescribed by his/her mental healthcare provider. Report any adverse effects and or reactions from the medicines to his/her outpatient provider promptly. Patient has been instructed & cautioned: To not engage in alcohol and or illegal drug use  while on prescription medicines. In the event of worsening symptoms, patient is instructed to call the crisis hotline, 911 and or go to the nearest ED for appropriate evaluation and treatment of symptoms. To follow-up with his/her primary care provider for your other medical issues, concerns and or health care needs.    Signed: Gerlene Burdock Tequan Redmon, FNP 09/17/2017, 8:07 AM

## 2017-09-17 NOTE — [facility] Suicide Risk Assessment (Signed)
[facility] Discharge Suicide Risk Assessment   Principal Problem: Major depressive disorder, recurrent episode, severe (HCC) Discharge Diagnoses:  Patient Active Problem List   Diagnosis Date Noted  . Intentional benzodiazepine overdose (HCC) [T42.4X2A] 09/14/2017  . Suicidal ideation [R45.851] 09/14/2017  . Major depressive disorder, recurrent episode, severe (HCC) [F33.2] 09/13/2017  . Alcohol use disorder, severe, dependence (HCC) [F10.20] 09/13/2017  . Acute encephalopathy [G93.40] 09/12/2017    Total Time spent with patient: 30 minutes  Musculoskeletal: Strength & Muscle Tone: within normal limits Gait & Station: normal Patient leans: N/A  Psychiatric Specialty Exam: Review of Systems  All other systems reviewed and are negative.   Blood pressure 123/88, pulse 75, temperature 98.1 F (36.7 C), temperature source Oral, resp. rate 20, height 6' (1.829 m), weight 105.7 kg (233 lb).Body mass index is 31.6 kg/m.  General Appearance: Casual  Eye Contact::  Good  Speech:  Clear and Coherent409  Volume:  Normal  Mood:  Euthymic  Affect:  Appropriate  Thought Process:  Coherent  Orientation:  Full (Time, Place, and Person)  Thought Content:  Logical  Suicidal Thoughts:  No  Homicidal Thoughts:  No  Memory:  Immediate;   Fair  Judgement:  Fair  Insight:  Lacking  Psychomotor Activity:  Normal  Concentration:  Good  Recall:  Good  Fund of Knowledge:Good  Language: Good  Akathisia:  No  Handed:  Right  AIMS (if indicated):     Assets:  Desire for Improvement  Sleep:  Number of Hours: 5  Cognition: WNL  ADL's:  Intact   Mental Status Per Nursing Assessment::   On Admission:     Demographic Factors:  Male, Divorced or widowed and Caucasian  Loss Factors: NA  Historical Factors: Impulsivity  Risk Reduction Factors:   Employed  Continued Clinical Symptoms:  Alcohol/Substance Abuse/Dependencies  Cognitive Features That Contribute To Risk:  None    Suicide  Risk:  Minimal: No identifiable suicidal ideation.  Patients presenting with no risk factors but with morbid ruminations; may be classified as minimal risk based on the severity of the depressive symptoms    Plan Of Care/Follow-up recommendations:  Activity:  ad lib  Antonieta Pert, MD 09/17/2017, 7:46 AM

## 2017-09-17 NOTE — Progress Notes (Signed)
  [facility] Adult Case Management Discharge Plan :  Will you be returning to the same living situation after discharge:  Yes,  patient plans to discharge home alone At discharge, do you have transportation home?: Yes,  patient reports he is going to take a taxi home Do you have the ability to pay for your medications: Yes,  employment  Release of information consent forms completed and in the chart;  Patient's signature needed at discharge.  Patient to Follow up at: Follow-up Information    PATIENT REFUSED FOLLOW UP Follow up.   Why:  PATIENT REFUSED FOLLOW UP Contact information: PATIENT REFUSED FOLLOW UP          Next level of care provider has access to [facility] Health Link:yes  Safety Planning and Suicide Prevention discussed: Yes,  with the patient's ex-girlfriend  Have you used any form of tobacco in the last 30 days? (Cigarettes, Smokeless Tobacco, Cigars, and/or Pipes): Yes  Has patient been referred to the Quitline?: Patient refused referral  Patient has been referred for addiction treatment: Pt. refused referral  Maeola Sarah, LCSWA 09/17/2017, 9:50 AM

## 2017-09-17 NOTE — [facility] Suicide Risk Assessment (Deleted)
[facility] Discharge Suicide Risk Assessment   Principal Problem: Major depressive disorder, recurrent episode, severe (HCC) Discharge Diagnoses:  Patient Active Problem List   Diagnosis Date Noted  . Intentional benzodiazepine overdose (HCC) [T42.4X2A] 09/14/2017  . Suicidal ideation [R45.851] 09/14/2017  . Major depressive disorder, recurrent episode, severe (HCC) [F33.2] 09/13/2017  . Alcohol use disorder, severe, dependence (HCC) [F10.20] 09/13/2017  . Acute encephalopathy [G93.40] 09/12/2017    Total Time spent with patient: 30 minutes  Musculoskeletal: Strength & Muscle Tone: within normal limits Gait & Station: normal Patient leans: N/A  Psychiatric Specialty Exam: Review of Systems  All other systems reviewed and are negative.   Blood pressure 123/88, pulse 75, temperature 98.1 F (36.7 C), temperature source Oral, resp. rate 20, height 6' (1.829 m), weight 105.7 kg (233 lb).Body mass index is 31.6 kg/m.  General Appearance: Casual  Eye Contact::  Good  Speech:  Normal Rate409  Volume:  Normal  Mood:  Euthymic  Affect:  Appropriate  Thought Process:  Coherent  Orientation:  Full (Time, Place, and Person)  Thought Content:  Logical  Suicidal Thoughts:  No  Homicidal Thoughts:  No  Memory:  Immediate;   Fair  Judgement:  Fair  Insight:  Fair  Psychomotor Activity:  Normal  Concentration:  Fair  Recall:  Good  Fund of Knowledge:Good  Language: Good  Akathisia:  Negative  Handed:  Right  AIMS (if indicated):     Assets:  Communication Skills Desire for Improvement Financial Resources/Insurance Housing Physical Health Resilience  Sleep:  Number of Hours: 5  Cognition: WNL  ADL's:  Intact   Mental Status Per Nursing Assessment::   On Admission:     Demographic Factors:  NA  Loss Factors: NA  Historical Factors: Impulsivity  Risk Reduction Factors:   Responsible for children under 89 years of age, Sense of responsibility to family, Living with  another person, especially a relative and Positive coping skills or problem solving skills  Continued Clinical Symptoms:  Previous Psychiatric Diagnoses and Treatments  Cognitive Features That Contribute To Risk:  None    Suicide Risk:  Minimal: No identifiable suicidal ideation.  Patients presenting with no risk factors but with morbid ruminations; may be classified as minimal risk based on the severity of the depressive symptoms    Plan Of Care/Follow-up recommendations:  Activity:  ad lib  Antonieta Pert, MD 09/17/2017, 7:37 AM

## 2017-09-17 NOTE — Progress Notes (Signed)
Patient did attend the evening speaker NA meeting.  

## 2017-09-17 NOTE — Progress Notes (Addendum)
Patient ID: Billy Lawrence, male   DOB: Jul 26, 1970, 47 y.o.   MRN: [id number]   Discharge Note  D) Patient discharged to lobby on 09/17/2017 10:51 AM. Patient states readiness for discharge. Patient denies SI/HI, AVH and is not delusional or psychotic. Patient in no acute distress. Patient has completed their Suicide Safety Plan. Patient provided an opportunity to complete and return Patient Satisfaction Survey.  A) Written and verbal discharge instructions given to the patient. Patient accepting to information and verbalized understanding. Patient agrees to the discharge plan. Opportunity for questions and concerns presented to patient. Patient denied any further questions or concerns. All belongings returned to patient. Patient signed for return of belongings and discharge paperwork. Patient provided a copy of their Suicide Safety Plan and has been provided a copy of the Patient Satisfaction Survey with return instructions. [employer] service with self pay contacted for patient use.  R) Patient safely escorted to the lobby. Patient discharged from [facility] with medication samples, prescriptions, personal belongings, follow-up appointment in place and discharge paperwork.

## 2018-05-06 ENCOUNTER — Other Ambulatory Visit: Payer: Self-pay

## 2018-05-06 ENCOUNTER — Encounter: Payer: Self-pay | Admitting: Family Medicine

## 2018-05-06 ENCOUNTER — Ambulatory Visit (INDEPENDENT_AMBULATORY_CARE_PROVIDER_SITE_OTHER): Payer: BLUE CROSS/BLUE SHIELD | Admitting: Family Medicine

## 2018-05-06 VITALS — BP 127/85 | HR 72 | Ht 72.8 in | Wt 231.0 lb

## 2018-05-06 DIAGNOSIS — Z7689 Persons encountering health services in other specified circumstances: Secondary | ICD-10-CM

## 2018-05-06 DIAGNOSIS — F1011 Alcohol abuse, in remission: Secondary | ICD-10-CM

## 2018-05-06 DIAGNOSIS — Z8659 Personal history of other mental and behavioral disorders: Secondary | ICD-10-CM

## 2018-05-06 DIAGNOSIS — F17210 Nicotine dependence, cigarettes, uncomplicated: Secondary | ICD-10-CM

## 2018-05-06 DIAGNOSIS — Z9189 Other specified personal risk factors, not elsewhere classified: Secondary | ICD-10-CM

## 2018-05-06 NOTE — Assessment & Plan Note (Signed)
Not interested in quitting, counseling given

## 2018-05-06 NOTE — Progress Notes (Signed)
BP 127/85   Pulse 72   Ht 6' 0.8" (1.849 m)   Wt 231 lb (104.8 kg)   SpO2 95%   BMI 30.64 kg/m    Subjective:    Patient ID: Billy Lawrence, male    DOB: [birth year], 47 y.o.   MRN: [id number]  HPI: Billy Lawrence is a 47 y.o. male  Chief Complaint  Patient presents with  . Establish Care    pt would like to discuss about his medical card renewed   Here today to establish care. 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 States he drank too much and took some of his ex-wife's xanax because he was acutely depressed. Had texted his daughter at the time saying he wanted to end things. Was admitted to the ICU and intubated, did recover from this and states it was a one time thing and he's not suicidal. Hx of cocaine and marijuana use, denies any addiction or "consumption" from these but does have alcoholism. Stopped drinking altogether shortly after this major event. Was given medications while inpatient in behavioral health for 3 days but refused the medicines on discharge. No hx of mental health issues prior to this.   Depression screen [facility] 2/9 05/06/2018  Decreased Interest 0  Down, Depressed, Hopeless 0  PHQ - 2 Score 0  Altered sleeping 0  Tired, decreased energy 0  Change in appetite 0  Feeling bad or failure about yourself  0  Trouble concentrating 0  Moving slowly or fidgety/restless 0  Suicidal thoughts 0  PHQ-9 Score 0    Otherwise, no known medical issues or medications. Has not had a general physical in a very long time per patient. Feels great overall with no concerns.   Relevant past medical, surgical, family and social history reviewed and updated as indicated. Interim medical history since our last visit reviewed. Allergies and medications reviewed and updated.  Review of Systems  Per HPI unless specifically  indicated above     Objective:    BP 127/85   Pulse 72   Ht 6' 0.8" (1.849 m)   Wt 231 lb (104.8 kg)   SpO2 95%   BMI 30.64 kg/m   Wt Readings from Last 3 Encounters:  05/06/18 231 lb (104.8 kg)  09/14/17 231 lb 0.7 oz (104.8 kg)    Physical Exam Vitals signs and nursing note reviewed.  Constitutional:      Appearance: Normal appearance.  HENT:     Head: Atraumatic.  Eyes:     Extraocular Movements: Extraocular movements intact.     Conjunctiva/sclera: Conjunctivae normal.  Neck:     Musculoskeletal: Normal range of motion and neck supple.  Cardiovascular:     Rate and Rhythm: Normal rate and regular rhythm.  Pulmonary:     Effort: Pulmonary effort is normal. No respiratory distress.  Musculoskeletal: Normal range of motion.  Skin:    General: Skin is warm and dry.  Neurological:     Mental Status: He is alert and oriented to person, place, and time.  Psychiatric:        Mood and Affect: Mood normal.        Thought Content: Thought content normal.        Judgment: Judgment normal.     Results for orders placed or performed during the hospital encounter of 09/12/17  MRSA PCR Screening  Result Value Ref Range   MRSA by PCR NEGATIVE NEGATIVE  Culture, respiratory (NON-Expectorated)  Result Value Ref Range   Specimen Description TRACHEAL ASPIRATE    Special Requests NONE    Gram Stain      ABUNDANT WBC PRESENT, PREDOMINANTLY PMN RARE SQUAMOUS EPITHELIAL CELLS PRESENT ABUNDANT GRAM POSITIVE COCCI IN CHAINS IN CLUSTERS MODERATE GRAM NEGATIVE RODS FEW GRAM POSITIVE RODS    Culture      Consistent with normal respiratory flora. Performed at [facility], [address] [place], [place] [address]    Report Status 09/15/2017 FINAL   Comprehensive metabolic panel  Result Value Ref Range   Sodium 140 135 - 145 mmol/L   Potassium 3.5 3.5 - 5.1 mmol/L   Chloride 105 101 - 111 mmol/L   CO2 21 (L) 22 - 32 mmol/L   Glucose, Bld 124 (H) 65 - 99 mg/dL   BUN 8  6 - 20 mg/dL   Creatinine, Ser 9.14 0.61 - 1.24 mg/dL   Calcium 8.7 (L) 8.9 - 10.3 mg/dL   Total Protein 6.5 6.5 - 8.1 g/dL   Albumin 4.0 3.5 - 5.0 g/dL   AST 33 15 - 41 U/L   ALT 26 17 - 63 U/L   Alkaline Phosphatase 60 38 - 126 U/L   Total Bilirubin 0.7 0.3 - 1.2 mg/dL   GFR calc non Af Amer >60 >60 mL/min   GFR calc Af Amer >60 >60 mL/min   Anion gap 14 5 - 15  Ethanol  Result Value Ref Range   Alcohol, Ethyl (B) 208 (H) <10 mg/dL  Urine rapid drug screen (hosp performed)  Result Value Ref Range   Opiates NONE DETECTED NONE DETECTED   Cocaine NONE DETECTED NONE DETECTED   Benzodiazepines POSITIVE (A) NONE DETECTED   Amphetamines NONE DETECTED NONE DETECTED   Tetrahydrocannabinol NONE DETECTED NONE DETECTED   Barbiturates NONE DETECTED NONE DETECTED  CBC with Diff  Result Value Ref Range   WBC 12.3 (H) 4.0 - 10.5 K/uL   RBC 5.51 4.22 - 5.81 MIL/uL   Hemoglobin 17.4 (H) 13.0 - 17.0 g/dL   HCT 78.2 95.6 - 21.3 %   MCV 93.8 78.0 - 100.0 fL   MCH 31.6 26.0 - 34.0 pg   MCHC 33.7 30.0 - 36.0 g/dL   RDW 08.6 57.8 - 46.9 %   Platelets 233 150 - 400 K/uL   Neutrophils Relative % 64 %   Neutro Abs 7.9 (H) 1.7 - 7.7 K/uL   Lymphocytes Relative 30 %   Lymphs Abs 3.6 0.7 - 4.0 K/uL   Monocytes Relative 5 %   Monocytes Absolute 0.6 0.1 - 1.0 K/uL   Eosinophils Relative 1 %   Eosinophils Absolute 0.2 0.0 - 0.7 K/uL   Basophils Relative 0 %   Basophils Absolute 0.0 0.0 - 0.1 K/uL  Acetaminophen level  Result Value Ref Range   Acetaminophen (Tylenol), Serum <10 (L) 10 - 30 ug/mL  Salicylate level  Result Value Ref Range   Salicylate Lvl <7.0 2.8 - 30.0 mg/dL  Urinalysis, Routine w reflex microscopic  Result Value Ref Range   Color, Urine YELLOW YELLOW   APPearance CLEAR CLEAR   Specific Gravity, Urine 1.005 1.005 - 1.030   pH 5.0 5.0 - 8.0   Glucose, UA NEGATIVE NEGATIVE mg/dL   Hgb urine dipstick SMALL (A) NEGATIVE   Bilirubin Urine NEGATIVE NEGATIVE   Ketones, ur  NEGATIVE NEGATIVE mg/dL   Protein,  ur NEGATIVE NEGATIVE mg/dL   Nitrite NEGATIVE NEGATIVE   Leukocytes, UA NEGATIVE NEGATIVE   RBC / HPF 0-5 0 - 5 RBC/hpf   Bacteria, UA NONE SEEN NONE SEEN   Mucus PRESENT   Triglycerides  Result Value Ref Range   Triglycerides 178 (H) <150 mg/dL  Magnesium  Result Value Ref Range   Magnesium 1.6 (L) 1.7 - 2.4 mg/dL  Phosphorus  Result Value Ref Range   Phosphorus 3.5 2.5 - 4.6 mg/dL  Lactic acid, plasma  Result Value Ref Range   Lactic Acid, Venous 2.8 (HH) 0.5 - 1.9 mmol/L  HIV antibody (Routine Testing)  Result Value Ref Range   HIV Screen 4th Generation wRfx Non Reactive Non Reactive  CBC  Result Value Ref Range   WBC 21.1 (H) 4.0 - 10.5 K/uL   RBC 5.47 4.22 - 5.81 MIL/uL   Hemoglobin 17.4 (H) 13.0 - 17.0 g/dL   HCT 16.151.0 09.639.0 - 04.552.0 %   MCV 93.2 78.0 - 100.0 fL   MCH 31.8 26.0 - 34.0 pg   MCHC 34.1 30.0 - 36.0 g/dL   RDW 40.914.9 81.111.5 - 91.415.5 %   Platelets 237 150 - 400 K/uL  Basic metabolic panel  Result Value Ref Range   Sodium 142 135 - 145 mmol/L   Potassium 3.8 3.5 - 5.1 mmol/L   Chloride 104 101 - 111 mmol/L   CO2 21 (L) 22 - 32 mmol/L   Glucose, Bld 62 (L) 65 - 99 mg/dL   BUN 8 6 - 20 mg/dL   Creatinine, Ser 7.820.94 0.61 - 1.24 mg/dL   Calcium 8.6 (L) 8.9 - 10.3 mg/dL   GFR calc non Af Amer >60 >60 mL/min   GFR calc Af Amer >60 >60 mL/min   Anion gap 17 (H) 5 - 15  Phosphorus  Result Value Ref Range   Phosphorus 3.3 2.5 - 4.6 mg/dL  Magnesium  Result Value Ref Range   Magnesium 1.8 1.7 - 2.4 mg/dL  Procalcitonin - Baseline  Result Value Ref Range   Procalcitonin <0.10 ng/mL  Glucose, capillary  Result Value Ref Range   Glucose-Capillary 100 (H) 65 - 99 mg/dL  Troponin I  Result Value Ref Range   Troponin I <0.03 <0.03 ng/mL  Glucose, capillary  Result Value Ref Range   Glucose-Capillary 70 65 - 99 mg/dL  Glucose, capillary  Result Value Ref Range   Glucose-Capillary 99 65 - 99 mg/dL  Glucose, capillary  Result  Value Ref Range   Glucose-Capillary 109 (H) 65 - 99 mg/dL  Glucose, capillary  Result Value Ref Range   Glucose-Capillary 90 65 - 99 mg/dL  Magnesium  Result Value Ref Range   Magnesium 2.0 1.7 - 2.4 mg/dL  CBC  Result Value Ref Range   WBC 14.3 (H) 4.0 - 10.5 K/uL   RBC 4.96 4.22 - 5.81 MIL/uL   Hemoglobin 15.5 13.0 - 17.0 g/dL   HCT 95.646.5 21.339.0 - 08.652.0 %   MCV 93.8 78.0 - 100.0 fL   MCH 31.3 26.0 - 34.0 pg   MCHC 33.3 30.0 - 36.0 g/dL   RDW 57.814.8 46.911.5 - 62.915.5 %   Platelets 199 150 - 400 K/uL  Basic metabolic panel  Result Value Ref Range   Sodium 138 135 - 145 mmol/L   Potassium 3.5 3.5 - 5.1 mmol/L   Chloride 104 101 - 111 mmol/L   CO2 26 22 - 32 mmol/L   Glucose, Bld 140 (H) 65 - 99 mg/dL  BUN 8 6 - 20 mg/dL   Creatinine, Ser 1.61 0.61 - 1.24 mg/dL   Calcium 8.0 (L) 8.9 - 10.3 mg/dL   GFR calc non Af Amer >60 >60 mL/min   GFR calc Af Amer >60 >60 mL/min   Anion gap 8 5 - 15  Glucose, capillary  Result Value Ref Range   Glucose-Capillary 122 (H) 65 - 99 mg/dL  Glucose, capillary  Result Value Ref Range   Glucose-Capillary 136 (H) 65 - 99 mg/dL  Glucose, capillary  Result Value Ref Range   Glucose-Capillary 134 (H) 65 - 99 mg/dL  Glucose, capillary  Result Value Ref Range   Glucose-Capillary 112 (H) 65 - 99 mg/dL  Glucose, capillary  Result Value Ref Range   Glucose-Capillary 179 (H) 65 - 99 mg/dL   Comment 1 Notify RN   Glucose, capillary  Result Value Ref Range   Glucose-Capillary 114 (H) 65 - 99 mg/dL  I-Stat Chem 8, ED  Result Value Ref Range   Sodium 142 135 - 145 mmol/L   Potassium 3.2 (L) 3.5 - 5.1 mmol/L   Chloride 106 101 - 111 mmol/L   BUN 8 6 - 20 mg/dL   Creatinine, Ser 0.96 (H) 0.61 - 1.24 mg/dL   Glucose, Bld 045 (H) 65 - 99 mg/dL   Calcium, Ion 4.09 (L) 1.15 - 1.40 mmol/L   TCO2 23 22 - 32 mmol/L   Hemoglobin 17.7 (H) 13.0 - 17.0 g/dL   HCT 81.1 91.4 - 78.2 %  I-Stat CG4 Lactic Acid, ED  Result Value Ref Range   Lactic Acid, Venous 2.85  (HH) 0.5 - 1.9 mmol/L   Comment NOTIFIED PHYSICIAN   I-Stat Troponin, ED (not at [facility])  Result Value Ref Range   Troponin i, poc 0.00 0.00 - 0.08 ng/mL   Comment 3          I-Stat CG4 Lactic Acid, ED  Result Value Ref Range   Lactic Acid, Venous 3.08 (HH) 0.5 - 1.9 mmol/L   Comment NOTIFIED PHYSICIAN   I-STAT 3, arterial blood gas (G3+)  Result Value Ref Range   pH, Arterial 7.397 7.350 - 7.450   pCO2 arterial 37.0 32.0 - 48.0 mmHg   pO2, Arterial 117.0 (H) 83.0 - 108.0 mmHg   Bicarbonate 22.9 20.0 - 28.0 mmol/L   TCO2 24 22 - 32 mmol/L   O2 Saturation 99.0 %   Acid-base deficit 2.0 0.0 - 2.0 mmol/L   Patient temperature 97.8 F    Collection site RADIAL, ALLEN'S TEST ACCEPTABLE    Drawn by RT    Sample type ARTERIAL   I-STAT 3, arterial blood gas (G3+)  Result Value Ref Range   pH, Arterial 7.361 7.350 - 7.450   pCO2 arterial 49.0 (H) 32.0 - 48.0 mmHg   pO2, Arterial 166.0 (H) 83.0 - 108.0 mmHg   Bicarbonate 27.6 20.0 - 28.0 mmol/L   TCO2 29 22 - 32 mmol/L   O2 Saturation 99.0 %   Acid-Base Excess 1.0 0.0 - 2.0 mmol/L   Patient temperature 99.6 F    Collection site RADIAL, ALLEN'S TEST ACCEPTABLE    Drawn by RT    Sample type ARTERIAL       Assessment & Plan:   Problem List Items Addressed This Visit      Other   History of depression    Unclear extent, pt states the overdose was an isolated event and he's never otherwise had issues but do not have any past records available  aside from hospital notes. Strongly encouraged him to establish with Psychiatry for periodic check ins to make sure things are still going well given his history. Declines any sort of medicines as he "feels great". No current SI/HI.       Alcohol abuse, in remission - Primary    Congratulated remission, recommended periodic counseling to help as a supportive measure. Pt agreeable to referral      Relevant Orders   Ambulatory referral to Psychiatry   History of drug overdose    Admission 09/2017  for significant intentional overdose of his ex wife's xanax and alcohol, states this was an isolated event and will never happen again. Will refer to Psychiatry for evaluation of history of SI and history of polysubstance use/abuse. 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 Recommended he go to [facility] walk in clinic ASAP to discuss this and also establish with a clinic for long term management of these issues      Relevant Orders   Ambulatory referral to Psychiatry   History of suicidal ideation   Relevant Orders   Ambulatory referral to Psychiatry   Cigarette smoker    Not interested in quitting, counseling given       Other Visit Diagnoses    Encounter to establish care        Greater than 25 min spent in patient care and coordination.    Follow up plan: Return for CPE.

## 2018-05-06 NOTE — Assessment & Plan Note (Signed)
Admission 09/2017 for significant intentional overdose of his ex wife's xanax and alcohol, states this was an isolated event and will never happen again. Will refer to Psychiatry for evaluation of history of SI and history of polysubstance use/abuse. 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 Recommended he go to [facility] walk in clinic ASAP to discuss this and also establish with a clinic for long term management of these issues

## 2018-05-06 NOTE — Assessment & Plan Note (Signed)
Congratulated remission, recommended periodic counseling to help as a supportive measure. Pt agreeable to referral

## 2018-05-06 NOTE — Assessment & Plan Note (Signed)
Unclear extent, pt states the overdose was an isolated event and he's never otherwise had issues but do not have any past records available aside from hospital notes. Strongly encouraged him to establish with Psychiatry for periodic check ins to make sure things are still going well given his history. Declines any sort of medicines as he "feels great". No current SI/HI.

## 2018-05-06 NOTE — Patient Instructions (Addendum)
[facility] in town has walk in hours Mon, Wed, Fri if you ever have issues in the future and need to speak with a Psychiatrist.  [address], [place], [place] [id number] [phone]

## 2018-06-28 ENCOUNTER — Encounter: Payer: Self-pay | Admitting: Family Medicine

## 2018-07-05 ENCOUNTER — Telehealth: Payer: Self-pay | Admitting: Family Medicine

## 2018-07-05 NOTE — Telephone Encounter (Signed)
Please cancel referral, pt declines going  Copied from CRM [id number]. Topic: General - Other >> Jul 02, 2018  3:51 PM Jilda Roche wrote: Reason for CRM: Pt received a letter to follow up with Behavior health he states he does not need an appt with them he has his medical card so please update his record to reflect  Call back is [phone] >> Jul 02, 2018  4:20 PM Harriet Pho wrote: Please advise

## 2018-10-28 IMAGING — DX DG CHEST 1V PORT
1 series · 1 of 1 positions shown · non-contrast
Comparison: None.

CLINICAL DATA: Post intubation

EXAM:
PORTABLE CHEST 1 VIEW

[chest ap]
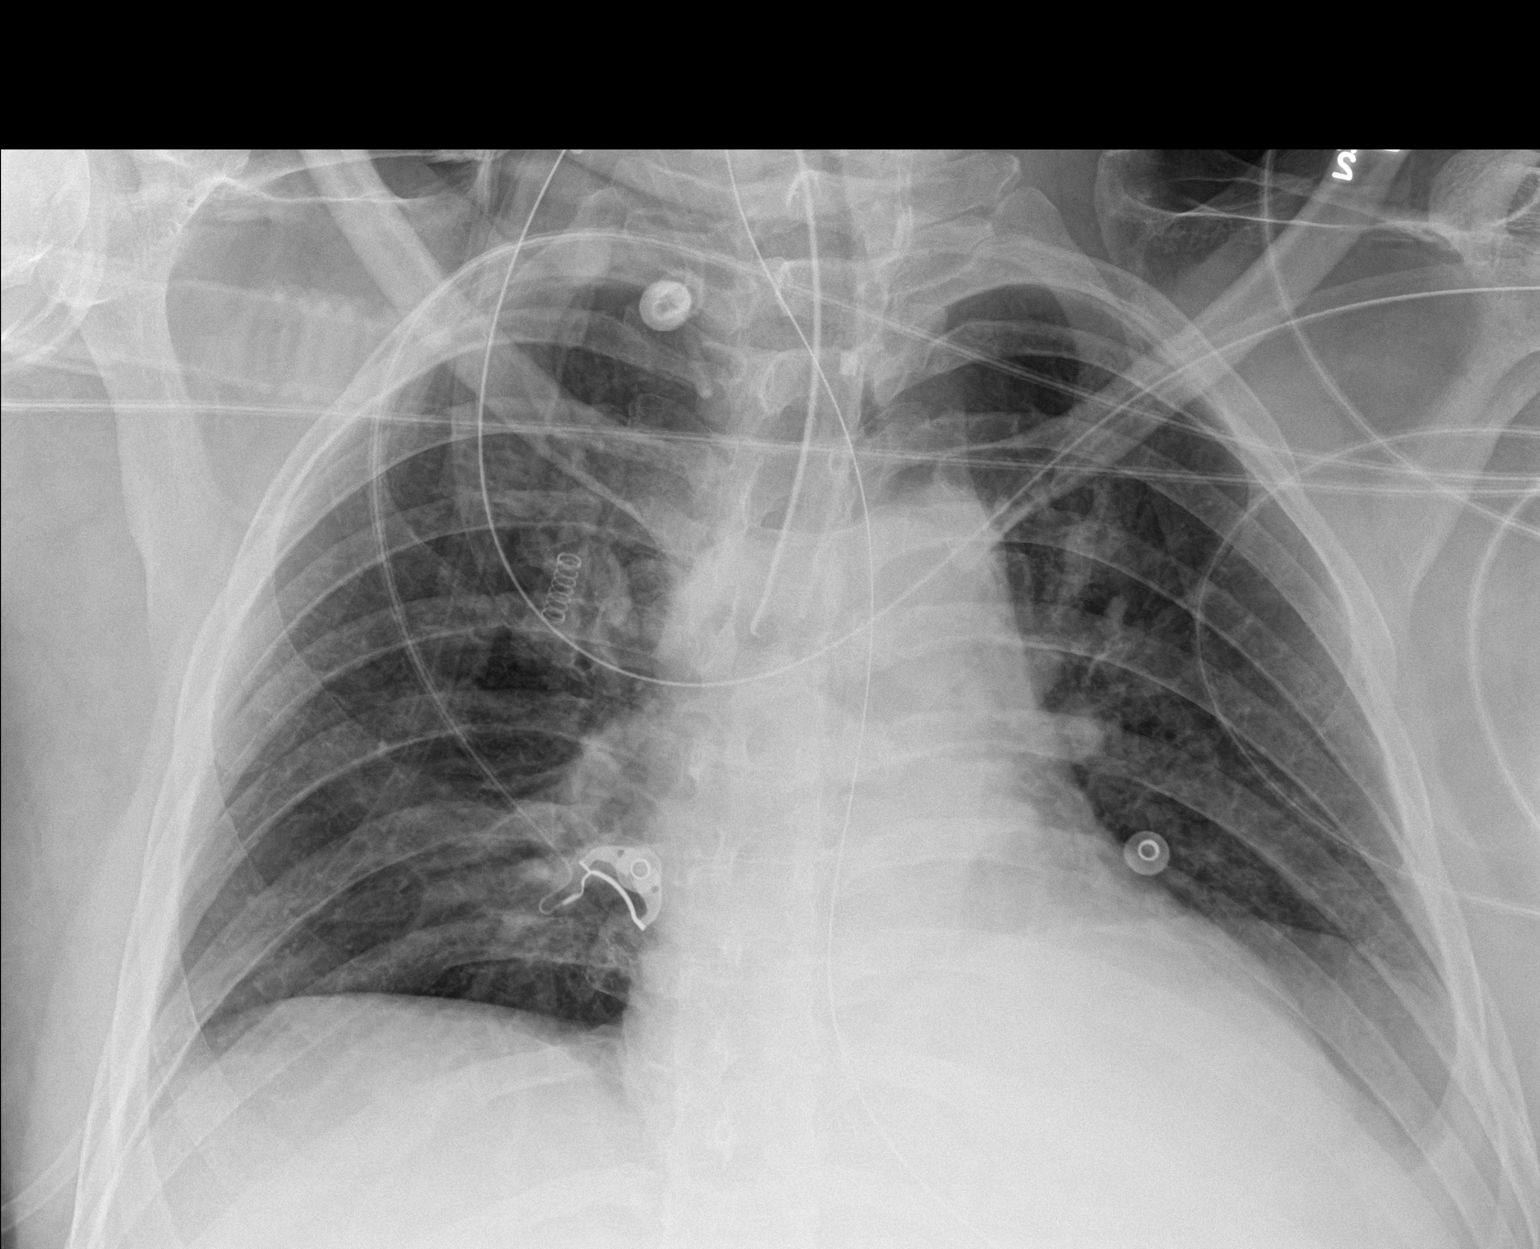

[1 of 1 positions shown; findings below may reference images not displayed]

FINDINGS: Endotracheal tube tip is at the carina. Esophageal tube tip is below
the diaphragm but non included. Right lung is clear. Patchy
atelectasis at the left base. Cardiomediastinal silhouette within
normal limits. No pneumothorax.
IMPRESSION: 1. Endotracheal tube tip at the carina
2. Esophageal tube tip below the diaphragm but non included
3. Slight elevation of left diaphragm with left basilar atelectasis

## 2018-10-28 IMAGING — CT CT CERVICAL SPINE W/O CM
5 of 8 series · 11 of 33 positions shown, 12 images · non-contrast
Comparison: None.

CLINICAL DATA: 47-year-old male with head trauma.

EXAM:
CT HEAD WITHOUT CONTRAST
CT CERVICAL SPINE WITHOUT CONTRAST
TECHNIQUE: Multidetector CT imaging of the head and cervical spine was
performed following the standard protocol without intravenous
contrast. Multiplanar CT image reconstructions of the cervical spine
were also generated.

[Series 6: head bone · axial · 0.45mm/px · z∈[-19,+35]mm · 2 of 83 slices shown]
[im 28/83  bone]
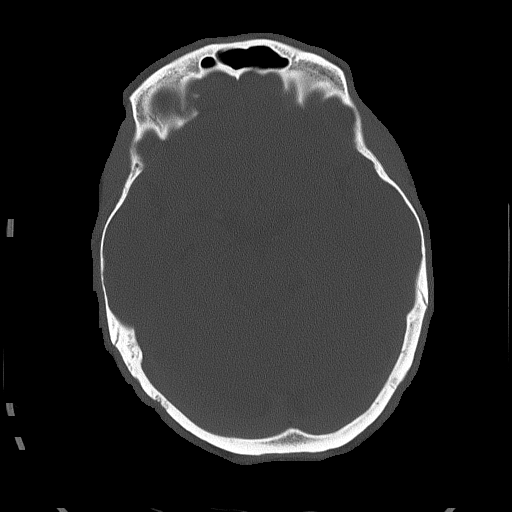
[im 55/83  bone]
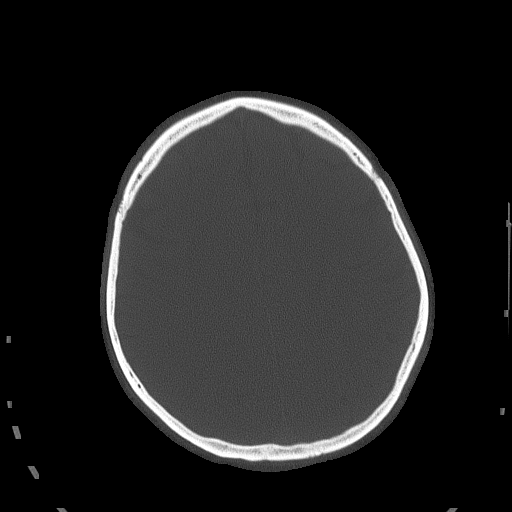

[Series 9: c_spine 2.0 st · axial · 0.33mm/px · z∈[-199,-141]mm · 2 of 89 slices shown]
[im 30/89  bone]
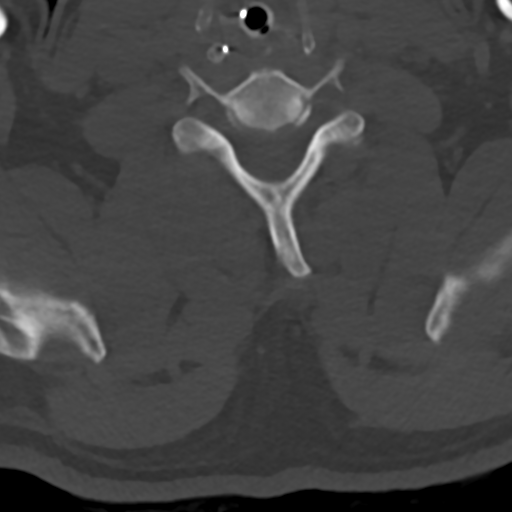
[im 59/89  bone]
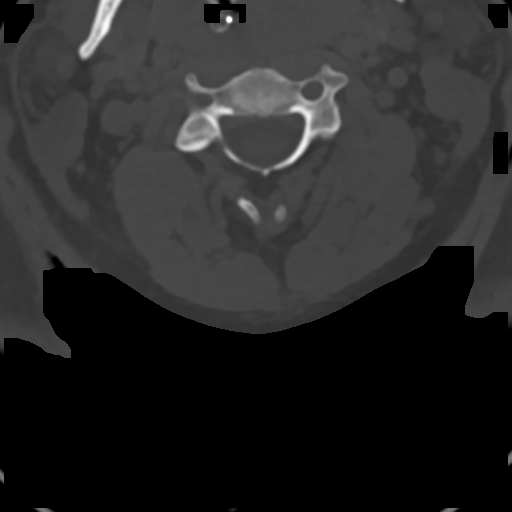

[Series 12: c_spine 2.0 sag bone · sagittal · 0.35mm/px · 4 of 47 slices shown]
[im 10/47  bone]
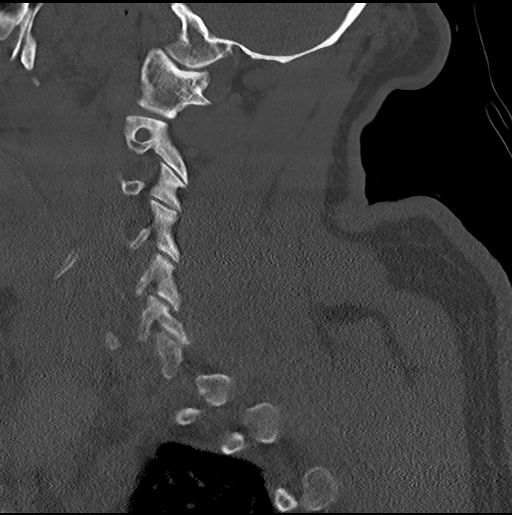
[im 19/47  bone]
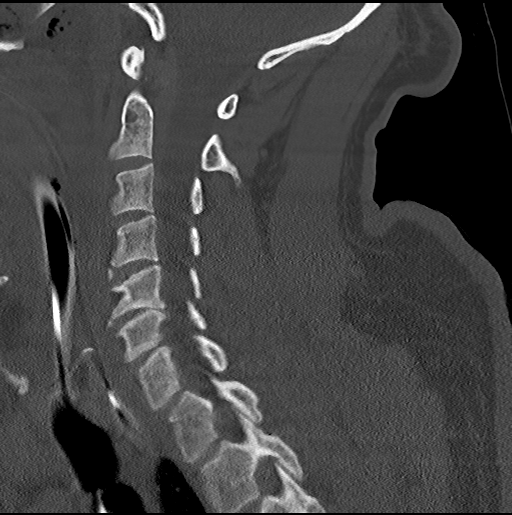
[im 28/47  bone]
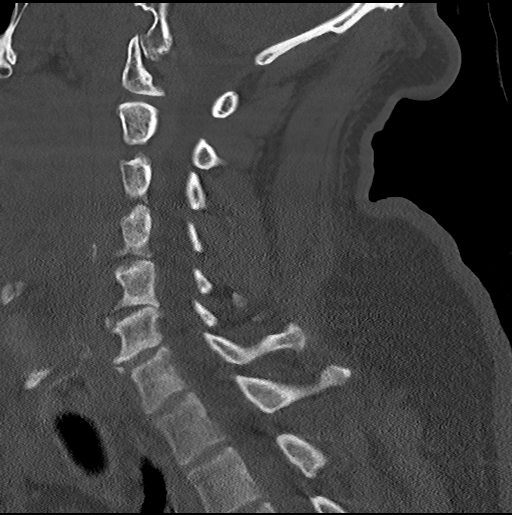
[im 37/47  bone]
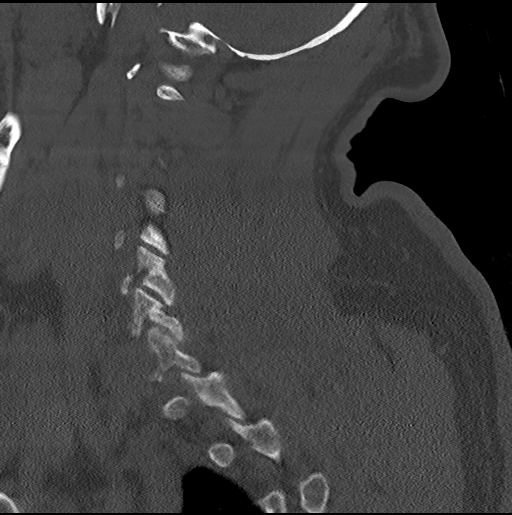

[Series 13: c_spine 2.0 cor bone · coronal · 0.26mm/px · 1 of 66 slices shown]
[im 33/66  bone]
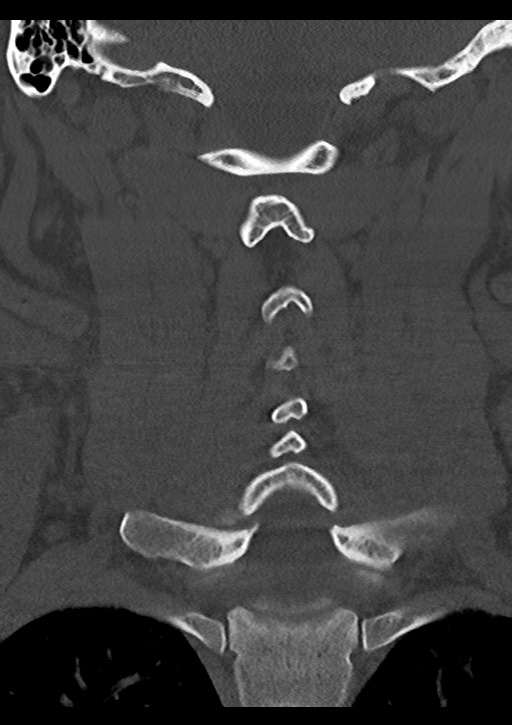

[Series 14: c_spine 2.0 orthogonals · axial · 0.21mm/px · z∈[-221,-151]mm · 2 of 95 slices shown, 3 images]
[im 32/95  soft-tissue]
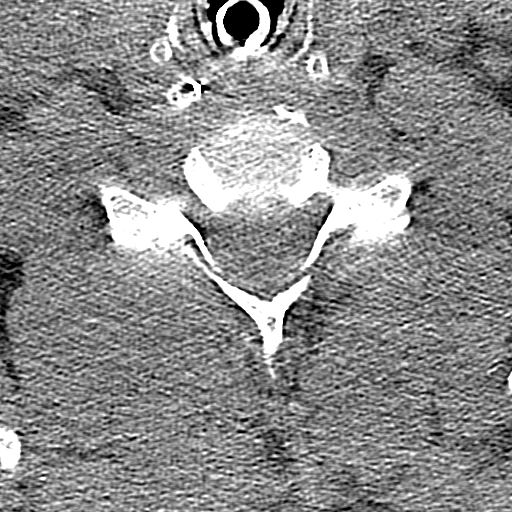
[im 32/95  bone]
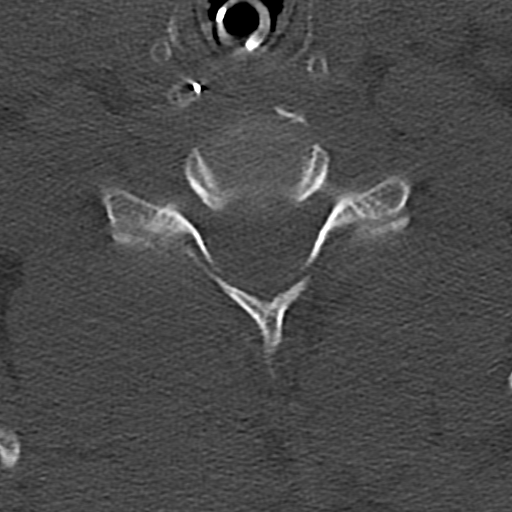
[im 63/95  bone]
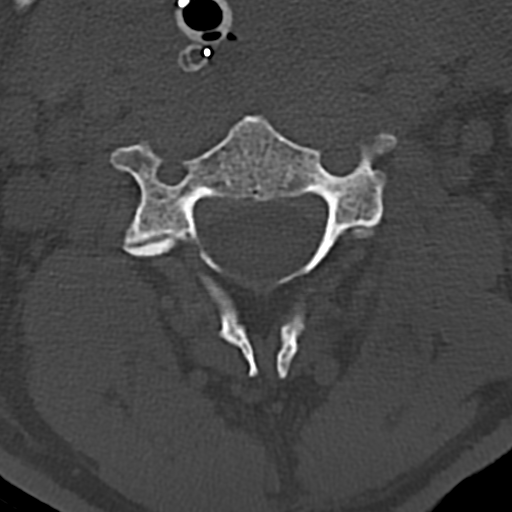

[11 of 33 positions shown; findings below may reference images not displayed]

FINDINGS: CT HEAD FINDINGS

Brain: No evidence of acute infarction, hemorrhage, hydrocephalus,
extra-axial collection or mass lesion/mass effect.

Vascular: No hyperdense vessel or unexpected calcification.

Skull: Normal. Negative for fracture or focal lesion.

Sinuses/Orbits: Mild diffuse mucoperiosteal thickening of paranasal
sinuses. No air-fluid levels. The mastoid air cells are clear.
Cerumen noted in the external auditory canals bilaterally.

Other: None

CT CERVICAL SPINE FINDINGS

Alignment: No acute subluxation.

Skull base and vertebrae: No acute fracture. No primary bone lesion
or focal pathologic process.

Soft tissues and spinal canal: No prevertebral fluid or swelling. No
visible canal hematoma.

Disc levels:  Mild degenerative changes primarily at C5-C6.

Upper chest: Negative.

Other: An endotracheal and an enteric tube are partially visualized.
IMPRESSION: 1. No acute intracranial pathology.
2. No acute/traumatic cervical spine pathology.

## 2018-10-30 IMAGING — DX DG CHEST 1V PORT
1 series · 1 of 1 positions shown · non-contrast
Comparison: 09/13/2017

CLINICAL DATA: Respiratory failure

EXAM:
PORTABLE CHEST 1 VIEW

[chest ap]
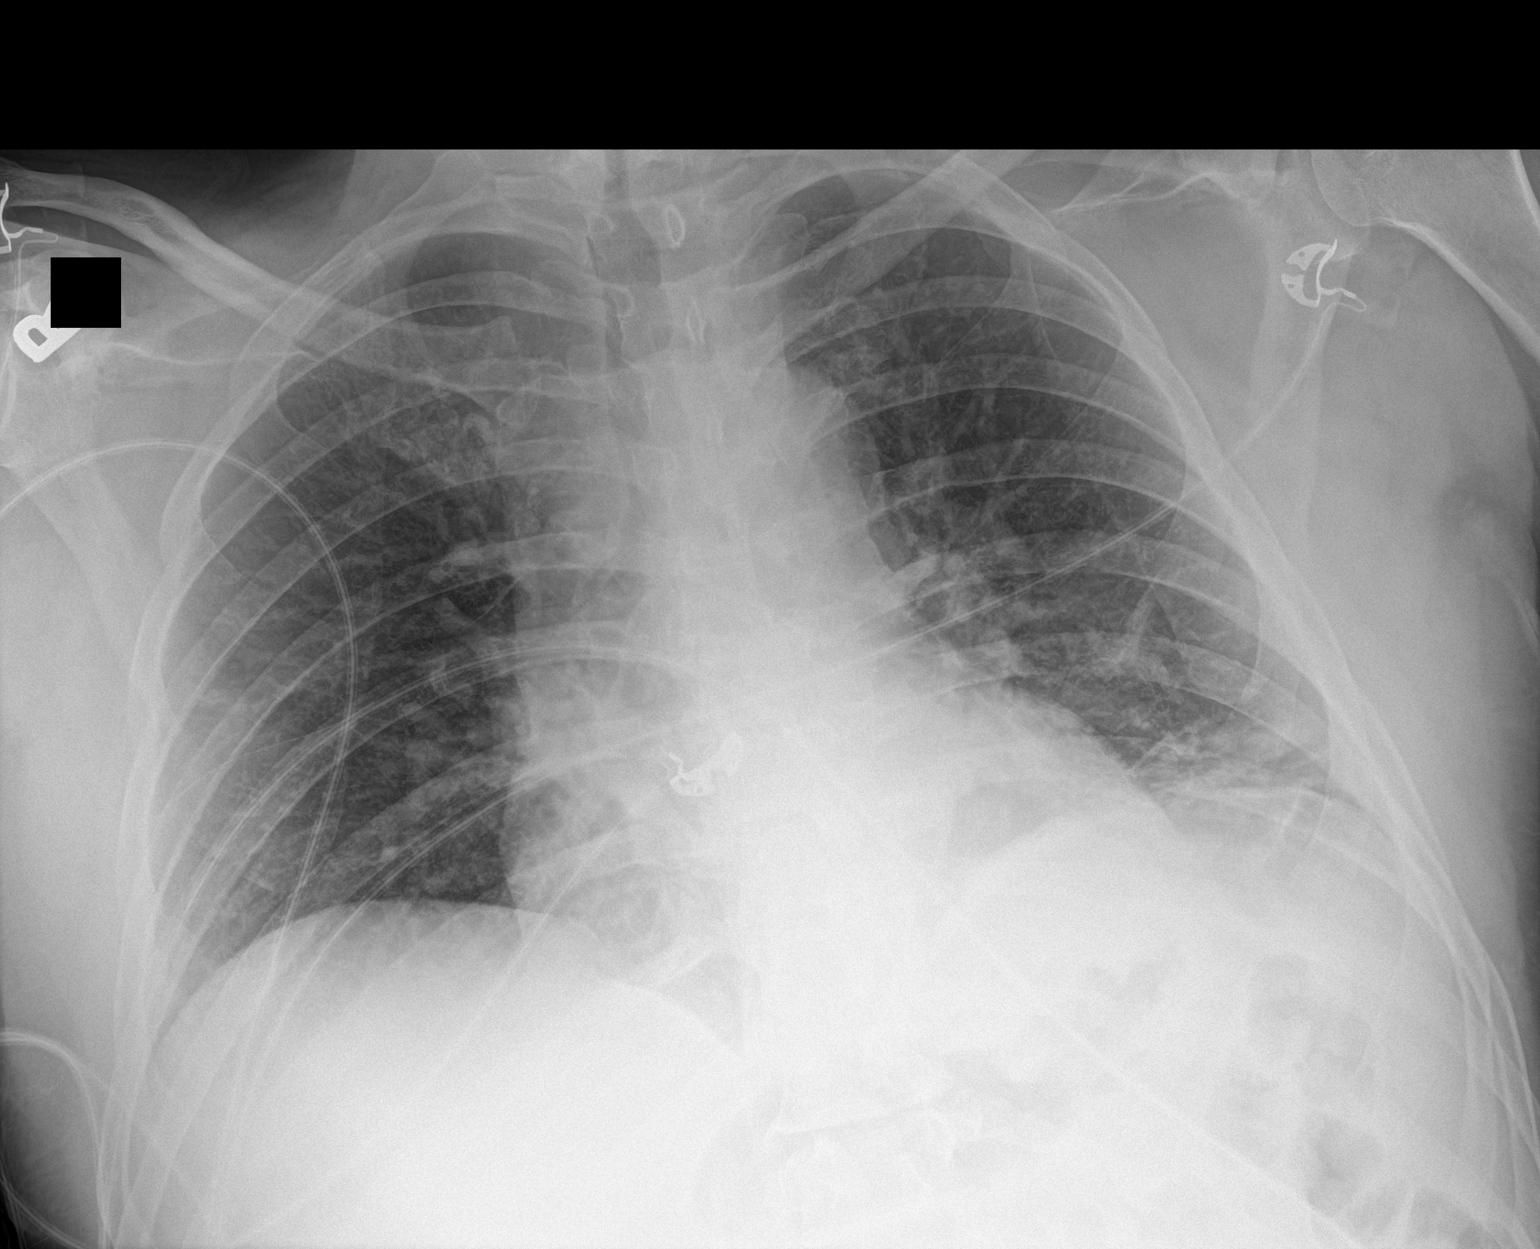

[1 of 1 positions shown; findings below may reference images not displayed]

FINDINGS: Cardiac shadow is stable. Endotracheal tube and nasogastric catheter
have been removed in the interval. Some mild left basilar
atelectasis/infiltrate is seen. No bony abnormality is noted.
IMPRESSION: Interval removal of tubes and lines. Mild left basilar atelectasis
is noted.
